# Patient Record
Sex: Male | Born: 1935 | Marital: Married | Smoking: Never smoker
Health system: Southern US, Community
[De-identification: ages and names within clinical notes are randomized; demographics above are authoritative.]

## PROBLEM LIST (undated history)

## (undated) DIAGNOSIS — C801 Malignant (primary) neoplasm, unspecified: Secondary | ICD-10-CM

## (undated) HISTORY — PX: HERNIA REPAIR: SHX51

---

## 2008-05-08 ENCOUNTER — Ambulatory Visit: Payer: Self-pay | Admitting: Surgery

## 2008-05-12 ENCOUNTER — Ambulatory Visit: Payer: Self-pay | Admitting: Surgery

## 2008-07-16 ENCOUNTER — Ambulatory Visit: Payer: Self-pay | Admitting: Internal Medicine

## 2008-08-06 ENCOUNTER — Ambulatory Visit: Payer: Self-pay | Admitting: Internal Medicine

## 2011-05-04 ENCOUNTER — Ambulatory Visit: Payer: Self-pay | Admitting: Pain Medicine

## 2011-05-09 ENCOUNTER — Ambulatory Visit: Payer: Self-pay | Admitting: Pain Medicine

## 2011-06-13 ENCOUNTER — Ambulatory Visit: Payer: Self-pay | Admitting: Pain Medicine

## 2011-07-12 ENCOUNTER — Ambulatory Visit: Payer: Self-pay | Admitting: Pain Medicine

## 2011-07-19 ENCOUNTER — Ambulatory Visit: Payer: Self-pay | Admitting: Pain Medicine

## 2011-07-27 ENCOUNTER — Ambulatory Visit: Payer: Self-pay | Admitting: Pain Medicine

## 2011-08-30 ENCOUNTER — Ambulatory Visit: Payer: Self-pay | Admitting: Pain Medicine

## 2013-02-14 ENCOUNTER — Ambulatory Visit: Payer: Self-pay | Admitting: Urology

## 2013-02-14 DIAGNOSIS — Z01810 Encounter for preprocedural cardiovascular examination: Secondary | ICD-10-CM

## 2013-02-26 ENCOUNTER — Ambulatory Visit: Payer: Self-pay | Admitting: Urology

## 2013-02-28 LAB — PATHOLOGY REPORT

## 2013-05-09 ENCOUNTER — Ambulatory Visit: Payer: Self-pay | Admitting: Pain Medicine

## 2013-05-22 ENCOUNTER — Ambulatory Visit: Payer: Self-pay | Admitting: Pain Medicine

## 2013-06-18 ENCOUNTER — Ambulatory Visit: Payer: Self-pay | Admitting: Pain Medicine

## 2013-07-01 ENCOUNTER — Ambulatory Visit: Payer: Self-pay | Admitting: Urology

## 2013-07-03 ENCOUNTER — Ambulatory Visit: Payer: Self-pay | Admitting: Pain Medicine

## 2013-07-09 ENCOUNTER — Ambulatory Visit: Payer: Self-pay | Admitting: Urology

## 2013-07-12 LAB — PATHOLOGY REPORT

## 2013-08-01 ENCOUNTER — Ambulatory Visit: Payer: Self-pay | Admitting: Pain Medicine

## 2014-08-15 NOTE — Op Note (Signed)
PATIENT NAME:  Troy Arroyo, Troy Arroyo MR#:  [id number] DATE OF BIRTH:  May 20, 1932  DATE OF PROCEDURE:  02/26/2013  PRINCIPAL DIAGNOSIS: Bladder tumor.   POSTOPERATIVE DIAGNOSIS: Bladder tumor.   PROCEDURE: Transurethral resection of bladder tumor, mitomycin bladder instillation.   SURGEON: Edrick Oh, M.D.   ANESTHESIA: General endotracheal anesthesia.   INDICATIONS: The patient is a 81 year old African American gentleman who was recently found to have hematuria. On evaluation he was noted to have a 1.5 to 2 cm papillary-appearing lesion that was somewhat irregular on the right posterolateral bladder wall. He presents for transurethral resection.   DESCRIPTION OF PROCEDURE: After informed consent was obtained, the patient was taken to the operating room and placed in the dorsal lithotomy position under general endotracheal anesthesia.  General endotracheal anesthesia was utilized due to the location of the tumor over concerns of an obturator reflex. The Saline resectoscope sheath was placed utilizing the visual obturator without difficulty. Upon entering the bladder, the mucosa was inspected in its entirety. There is an irregular 1.5 to 2 cm papillary-appearing lesion on the right lateral bladder base. Extensive hypervascularity was noted extending into the tumor region. No additional lesions were noted throughout the bladder. The resectoscope loop was placed through the sheath. Resection was begun at the posterior aspect of the tumor with a central section of the tumor removed in one segment. Additional passes of the resectoscope blade were utilized for complete removal. The resection was taken fairly deep into the bladder muscle. No bleeding was encountered. Extensive cauterization was undertaken of the tumor resection site and the surrounding edges of the hypervascular vessels were then also cauterized. The bladder was drained. The cystoscope was removed. The TUR chips were collected and will be sent for  pathology analysis. An 37 French red rubber catheter was then inserted without difficulty. 20 mg of mitomycin reconstituted in 50 mL of sterile saline was instilled into the urinary bladder through the Foley catheter. The catheter was then removed. Chemotherapy precautions were followed. The patient was returned to the supine position and awakened from general endotracheal anesthesia, was taken to the recovery room in stable condition. There were no problems or complications. The patient tolerated the procedure well.   ESTIMATED BLOOD LOSS: Minimal.    ____________________________ Denice Bors. Jacqlyn Larsen, MD bsc:dp D: 02/26/2013 11:50:26 ET T: 02/26/2013 12:01:14 ET JOB#: [id number]  cc: Denice Bors. Jacqlyn Larsen, MD, <Dictator> Denice Bors Shauna Bodkins MD ELECTRONICALLY SIGNED 02/26/2013 16:38

## 2014-08-16 NOTE — Op Note (Signed)
PATIENT NAME:  Troy Arroyo, Troy Arroyo MR#:  [id number] DATE OF BIRTH:  March 27, 1933  DATE OF PROCEDURE:  07/09/2013  PRINCIPAL DIAGNOSIS: Bladder tumor.   POSTOPERATIVE DIAGNOSIS: Bladder tumor.   PROCEDURE: Cystoscopy, bladder biopsy, mitomycin bladder instillation.   SURGEON: Denice Bors. Jacqlyn Larsen, MD   ANESTHESIA: Laryngeal mask airway anesthesia.   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 Recent cystoscopy demonstrated areas of papillary-appearing tumor on the anterior bladder wall approaching the bladder neck. There was a separate smaller area on the left posterior bladder also consistent with early recurrent papillary tumor. The site of the previous resection on the right demonstrates significant necrotic tissue with no evidence of active tumor. He presents for biopsy and treatment.   DESCRIPTION OF PROCEDURE: After informed consent was obtained, the patient was taken to the operating room and placed in the dorsal lithotomy position under laryngeal mask airway anesthesia. The patient was then prepped and draped in the usual standard fashion. The 22-French rigid cystoscope was introduced into the urethra under direct vision with no urethral abnormalities noted. Upon entering the bladder, the mucosa was inspected in its entirety. The previous area of resection just lateral to the right ureteral orifice demonstrates a large amount of necrotic tissue. Minimal erythema was noted surrounding the area of healing. There was no evidence of gross tumor. There was an approximately 1.5 cm area on the anterior bladder wall approaching the bladder neck with papillary-appearing tumor consistent with recurrent transitional cell carcinoma. There was a second 8 mm area on the left posterolateral bladder just behind the left ureteral orifice also consistent with early papillary-appearing tumor. A third smaller lesion was  noted just medial. Cold cup biopsy forceps were utilized to obtain multiple biopsies of the anterior tumor. This essentially resulted in resection of the tumor. Deeper bites were obtained for muscle. The area was then cauterized utilizing the Bugbee electrode. Pressure was applied to the abdomen for better visualization of the anterior tumor. The left posterolateral tumor was also removed utilizing cold cup biopsy forceps. This also included the second smaller lesion in close proximity. The area was extensively cauterized. The debris from the previous resection on the right was removed utilizing the biopsy forceps. The bladder was noted to be fairly thin at this area. The original decision was made to obtain biopsies from the base. This, however, was held due to the thinness of the bladder and the extent of the necrotic tumor. The bladder was irrigated of residual necrotic debris. The bladder was then drained. The cystoscope was removed. An 18-French red rubber catheter was inserted; 20 mg of mitomycin reconstituted in  50 mL of sterile water was instilled through the Foley catheter. The catheter was then removed. Proper chemotherapy protocols were followed. The patient tolerated the procedure well. There were no problems or complications.   ESTIMATED BLOOD LOSS: Minimal.   PATHOLOGY SPECIMENS: Included the anterior bladder wall and the left posterolateral bladder.    ____________________________ Denice Bors. Jacqlyn Larsen, MD bsc:jcm D: 07/09/2013 20:15:32 ET T: 07/09/2013 22:26:00 ET JOB#: [id number]  cc: Denice Bors. Jacqlyn Larsen, MD, <Dictator> Denice Bors Madgeline Rayo MD ELECTRONICALLY SIGNED 07/10/2013 17:42

## 2016-10-31 ENCOUNTER — Encounter: Payer: Self-pay | Admitting: Emergency Medicine

## 2016-10-31 ENCOUNTER — Emergency Department: Payer: Medicare HMO

## 2016-10-31 ENCOUNTER — Emergency Department
Admission: EM | Admit: 2016-10-31 | Discharge: 2016-10-31 | Disposition: A | Discharge status: Home / Self Care | Payer: Medicare HMO | Attending: Emergency Medicine | Admitting: Emergency Medicine

## 2016-10-31 DIAGNOSIS — M7918 Myalgia, other site: Secondary | ICD-10-CM

## 2016-10-31 DIAGNOSIS — Y998 Other external cause status: Secondary | ICD-10-CM | POA: Insufficient documentation

## 2016-10-31 DIAGNOSIS — M25551 Pain in right hip: Secondary | ICD-10-CM | POA: Diagnosis present

## 2016-10-31 DIAGNOSIS — M79604 Pain in right leg: Secondary | ICD-10-CM | POA: Diagnosis not present

## 2016-10-31 DIAGNOSIS — Y939 Activity, unspecified: Secondary | ICD-10-CM | POA: Diagnosis not present

## 2016-10-31 DIAGNOSIS — Y929 Unspecified place or not applicable: Secondary | ICD-10-CM | POA: Diagnosis not present

## 2016-10-31 DIAGNOSIS — M791 Myalgia: Secondary | ICD-10-CM | POA: Insufficient documentation

## 2016-10-31 HISTORY — DX: Malignant (primary) neoplasm, unspecified: C80.1

## 2016-10-31 MED ORDER — TRAMADOL HCL 50 MG PO TABS: 50.0000 mg | ORAL_TABLET | Freq: Two times a day (BID) | ORAL | 0 refills | Status: DC | PRN
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
## 2016-11-17 ENCOUNTER — Ambulatory Visit: Payer: Medicare HMO | Attending: Internal Medicine

## 2016-11-17 DIAGNOSIS — M545 Low back pain: Secondary | ICD-10-CM

## 2016-11-17 DIAGNOSIS — M25551 Pain in right hip: Secondary | ICD-10-CM | POA: Diagnosis present

## 2016-11-17 DIAGNOSIS — R262 Difficulty in walking, not elsewhere classified: Secondary | ICD-10-CM | POA: Diagnosis present

## 2016-11-17 DIAGNOSIS — M6281 Muscle weakness (generalized): Secondary | ICD-10-CM | POA: Insufficient documentation

## 2016-11-17 NOTE — Patient Instructions (Addendum)
  Seated hip extension isometrics.  Sitting on a chair (chair against the wall)   Squeeze your rear end muscles together and gently press your left foot onto the floor.    Count out loud for 5 seconds.    Repeat 5 times.    Perform 3 sets daily.        Pt was also recommended to use his adjustable SPC on L side and raise it as high as his greater trochanter. Pt demonstrated and verbalized understanding.
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
## 2016-11-21 ENCOUNTER — Ambulatory Visit: Payer: Medicare HMO

## 2016-11-21 DIAGNOSIS — M545 Low back pain: Secondary | ICD-10-CM

## 2016-11-21 DIAGNOSIS — R262 Difficulty in walking, not elsewhere classified: Secondary | ICD-10-CM

## 2016-11-21 DIAGNOSIS — M6281 Muscle weakness (generalized): Secondary | ICD-10-CM

## 2016-11-21 DIAGNOSIS — M25551 Pain in right hip: Secondary | ICD-10-CM

## 2016-11-21 NOTE — Patient Instructions (Signed)
   Do this exercise after you do the one when you are pressing your left foot onto the floor    Adduction: Hip - Knees Together (Sitting)   Sit with two folded pillows (not shown)  between knees. Squeeze your rear and muscles together and push knees together. Hold for _5__ seconds. Repeat _10__ times. Do __3_ times a day.  Copyright  VHI. All rights reserved.
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
## 2016-11-23 ENCOUNTER — Ambulatory Visit: Payer: Medicare HMO | Attending: Internal Medicine

## 2016-11-23 DIAGNOSIS — M25551 Pain in right hip: Secondary | ICD-10-CM | POA: Diagnosis not present

## 2016-11-23 DIAGNOSIS — R262 Difficulty in walking, not elsewhere classified: Secondary | ICD-10-CM | POA: Diagnosis present

## 2016-11-23 DIAGNOSIS — M545 Low back pain: Secondary | ICD-10-CM | POA: Diagnosis present

## 2016-11-23 DIAGNOSIS — M6281 Muscle weakness (generalized): Secondary | ICD-10-CM | POA: Diagnosis present
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
## 2016-11-29 ENCOUNTER — Ambulatory Visit: Payer: Medicare HMO

## 2016-11-29 DIAGNOSIS — M25551 Pain in right hip: Secondary | ICD-10-CM

## 2016-11-29 DIAGNOSIS — M6281 Muscle weakness (generalized): Secondary | ICD-10-CM

## 2016-11-29 DIAGNOSIS — M545 Low back pain: Secondary | ICD-10-CM

## 2016-11-29 DIAGNOSIS — R262 Difficulty in walking, not elsewhere classified: Secondary | ICD-10-CM
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
## 2016-12-01 ENCOUNTER — Ambulatory Visit: Payer: Medicare HMO

## 2016-12-01 DIAGNOSIS — M6281 Muscle weakness (generalized): Secondary | ICD-10-CM

## 2016-12-01 DIAGNOSIS — R262 Difficulty in walking, not elsewhere classified: Secondary | ICD-10-CM

## 2016-12-01 DIAGNOSIS — M25551 Pain in right hip: Secondary | ICD-10-CM | POA: Diagnosis not present

## 2016-12-01 DIAGNOSIS — M545 Low back pain: Secondary | ICD-10-CM
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
## 2016-12-05 ENCOUNTER — Ambulatory Visit: Payer: Medicare HMO

## 2016-12-05 DIAGNOSIS — M6281 Muscle weakness (generalized): Secondary | ICD-10-CM

## 2016-12-05 DIAGNOSIS — M25551 Pain in right hip: Secondary | ICD-10-CM

## 2016-12-05 DIAGNOSIS — M545 Low back pain: Secondary | ICD-10-CM

## 2016-12-05 DIAGNOSIS — R262 Difficulty in walking, not elsewhere classified: Secondary | ICD-10-CM
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
## 2016-12-07 ENCOUNTER — Ambulatory Visit: Payer: Medicare HMO

## 2016-12-08 ENCOUNTER — Ambulatory Visit: Payer: Medicare HMO

## 2016-12-08 DIAGNOSIS — M25551 Pain in right hip: Secondary | ICD-10-CM | POA: Diagnosis not present

## 2016-12-08 DIAGNOSIS — R262 Difficulty in walking, not elsewhere classified: Secondary | ICD-10-CM

## 2016-12-08 DIAGNOSIS — M545 Low back pain: Secondary | ICD-10-CM

## 2016-12-08 DIAGNOSIS — M6281 Muscle weakness (generalized): Secondary | ICD-10-CM
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
## 2016-12-12 ENCOUNTER — Ambulatory Visit: Payer: Medicare HMO

## 2016-12-12 DIAGNOSIS — M545 Low back pain: Secondary | ICD-10-CM

## 2016-12-12 DIAGNOSIS — M25551 Pain in right hip: Secondary | ICD-10-CM

## 2016-12-12 DIAGNOSIS — M6281 Muscle weakness (generalized): Secondary | ICD-10-CM

## 2016-12-12 DIAGNOSIS — R262 Difficulty in walking, not elsewhere classified: Secondary | ICD-10-CM
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
## 2016-12-14 ENCOUNTER — Ambulatory Visit: Payer: Medicare HMO

## 2016-12-14 DIAGNOSIS — R262 Difficulty in walking, not elsewhere classified: Secondary | ICD-10-CM

## 2016-12-14 DIAGNOSIS — M6281 Muscle weakness (generalized): Secondary | ICD-10-CM

## 2016-12-14 DIAGNOSIS — M25551 Pain in right hip: Secondary | ICD-10-CM | POA: Diagnosis not present

## 2016-12-14 DIAGNOSIS — M545 Low back pain: Secondary | ICD-10-CM
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
## 2016-12-19 ENCOUNTER — Ambulatory Visit: Payer: Medicare HMO

## 2016-12-19 DIAGNOSIS — M6281 Muscle weakness (generalized): Secondary | ICD-10-CM

## 2016-12-19 DIAGNOSIS — M25551 Pain in right hip: Secondary | ICD-10-CM

## 2016-12-19 DIAGNOSIS — R262 Difficulty in walking, not elsewhere classified: Secondary | ICD-10-CM

## 2016-12-19 DIAGNOSIS — M545 Low back pain: Secondary | ICD-10-CM
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
## 2016-12-21 ENCOUNTER — Ambulatory Visit: Payer: Medicare HMO

## 2016-12-21 DIAGNOSIS — R262 Difficulty in walking, not elsewhere classified: Secondary | ICD-10-CM

## 2016-12-21 DIAGNOSIS — M545 Low back pain: Secondary | ICD-10-CM

## 2016-12-21 DIAGNOSIS — M6281 Muscle weakness (generalized): Secondary | ICD-10-CM

## 2016-12-21 DIAGNOSIS — M25551 Pain in right hip: Secondary | ICD-10-CM | POA: Diagnosis not present
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
## 2016-12-27 ENCOUNTER — Ambulatory Visit: Payer: Medicare HMO | Attending: Internal Medicine

## 2016-12-27 DIAGNOSIS — M25551 Pain in right hip: Secondary | ICD-10-CM | POA: Diagnosis present

## 2016-12-27 DIAGNOSIS — M545 Low back pain: Secondary | ICD-10-CM | POA: Diagnosis present

## 2016-12-27 DIAGNOSIS — R262 Difficulty in walking, not elsewhere classified: Secondary | ICD-10-CM | POA: Diagnosis present

## 2016-12-27 DIAGNOSIS — M6281 Muscle weakness (generalized): Secondary | ICD-10-CM | POA: Insufficient documentation
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
## 2016-12-29 ENCOUNTER — Ambulatory Visit: Payer: Medicare HMO

## 2016-12-29 DIAGNOSIS — M25551 Pain in right hip: Secondary | ICD-10-CM

## 2016-12-29 DIAGNOSIS — R262 Difficulty in walking, not elsewhere classified: Secondary | ICD-10-CM

## 2016-12-29 DIAGNOSIS — M6281 Muscle weakness (generalized): Secondary | ICD-10-CM
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
## 2019-12-04 ENCOUNTER — Ambulatory Visit: Admit: 2019-12-04 | Payer: Medicare HMO | Admitting: Ophthalmology

## 2019-12-04 SURGERY — PHACOEMULSIFICATION, CATARACT, WITH IOL INSERTION
Anesthesia: Topical | Laterality: Left

## 2020-04-16 ENCOUNTER — Other Ambulatory Visit: Payer: Self-pay | Admitting: Internal Medicine

## 2020-04-16 DIAGNOSIS — R1310 Dysphagia, unspecified: Secondary | ICD-10-CM

## 2020-04-30 ENCOUNTER — Ambulatory Visit: Payer: Medicare HMO

## 2020-05-01 ENCOUNTER — Other Ambulatory Visit: Payer: Self-pay

## 2020-05-01 ENCOUNTER — Ambulatory Visit
Admission: RE | Admit: 2020-05-01 | Discharge: 2020-05-01 | Disposition: A | Discharge status: Home / Self Care | Payer: Medicare HMO | Source: Ambulatory Visit | Attending: Internal Medicine | Admitting: Internal Medicine

## 2020-05-01 DIAGNOSIS — R1310 Dysphagia, unspecified: Secondary | ICD-10-CM | POA: Diagnosis present

## 2021-01-25 ENCOUNTER — Ambulatory Visit: Admit: 2021-01-25 | Payer: Medicare HMO | Admitting: Internal Medicine

## 2021-01-25 SURGERY — ESOPHAGOGASTRODUODENOSCOPY (EGD) WITH PROPOFOL
Anesthesia: General

## 2022-05-26 ENCOUNTER — Other Ambulatory Visit: Payer: Self-pay | Admitting: Internal Medicine

## 2022-05-27 LAB — CBC WITH DIFFERENTIAL/PLATELET
Basophils Absolute: 0.1 10*3/uL (ref 0.0–0.2)
Basos: 1 %
EOS (ABSOLUTE): 0.1 10*3/uL (ref 0.0–0.4)
Eos: 2 %
Hematocrit: 38.6 % (ref 37.5–51.0)
Hemoglobin: 12.7 g/dL — ABNORMAL LOW (ref 13.0–17.7)
Immature Grans (Abs): 0.1 10*3/uL (ref 0.0–0.1)
Immature Granulocytes: 1 %
Lymphocytes Absolute: 2.1 10*3/uL (ref 0.7–3.1)
Lymphs: 31 %
MCH: 30 pg (ref 26.6–33.0)
MCHC: 32.9 g/dL (ref 31.5–35.7)
MCV: 91 fL (ref 79–97)
Monocytes Absolute: 0.7 10*3/uL (ref 0.1–0.9)
Monocytes: 11 %
Neutrophils Absolute: 3.8 10*3/uL (ref 1.4–7.0)
Neutrophils: 54 %
Platelets: 278 10*3/uL (ref 150–450)
RBC: 4.23 x10E6/uL (ref 4.14–5.80)
RDW: 12.3 % (ref 11.6–15.4)
WBC: 6.8 10*3/uL (ref 3.4–10.8)

## 2022-05-27 LAB — COMPREHENSIVE METABOLIC PANEL
ALT: 14 IU/L (ref 0–44)
AST: 21 IU/L (ref 0–40)
Albumin/Globulin Ratio: 1.6 (ref 1.2–2.2)
Albumin: 4.2 g/dL (ref 3.7–4.7)
Alkaline Phosphatase: 112 IU/L (ref 44–121)
BUN/Creatinine Ratio: 24 (ref 10–24)
BUN: 24 mg/dL (ref 8–27)
Bilirubin Total: 0.4 mg/dL (ref 0.0–1.2)
CO2: 23 mmol/L (ref 20–29)
Calcium: 9.3 mg/dL (ref 8.6–10.2)
Chloride: 100 mmol/L (ref 96–106)
Creatinine, Ser: 1.02 mg/dL (ref 0.76–1.27)
Globulin, Total: 2.7 g/dL (ref 1.5–4.5)
Glucose: 82 mg/dL (ref 70–99)
Potassium: 5.1 mmol/L (ref 3.5–5.2)
Sodium: 138 mmol/L (ref 134–144)
Total Protein: 6.9 g/dL (ref 6.0–8.5)
eGFR: 71 mL/min/{1.73_m2} (ref >59)

## 2022-05-27 LAB — LIPID PANEL W/O CHOL/HDL RATIO
Cholesterol, Total: 176 mg/dL (ref 100–199)
HDL: 88 mg/dL (ref >39)
LDL Chol Calc (NIH): 79 mg/dL (ref 0–99)
Triglycerides: 44 mg/dL (ref 0–149)
VLDL Cholesterol Cal: 9 mg/dL (ref 5–40)

## 2022-05-27 LAB — CK: Total CK: 366 U/L — ABNORMAL HIGH (ref 30–208)

## 2022-05-27 LAB — VITAMIN B12: Vitamin B-12: 252 pg/mL (ref 232–1245)

## 2022-05-30 ENCOUNTER — Encounter: Payer: Self-pay | Admitting: Internal Medicine

## 2022-05-30 ENCOUNTER — Ambulatory Visit (INDEPENDENT_AMBULATORY_CARE_PROVIDER_SITE_OTHER): Payer: Medicare HMO | Admitting: Internal Medicine

## 2022-05-30 VITALS — BP 140/60 | HR 66 | Ht 69.0 in | Wt 156.4 lb

## 2022-05-30 DIAGNOSIS — M5431 Sciatica, right side: Secondary | ICD-10-CM

## 2022-05-30 DIAGNOSIS — D538 Other specified nutritional anemias: Secondary | ICD-10-CM

## 2022-05-30 DIAGNOSIS — E7800 Pure hypercholesterolemia, unspecified: Secondary | ICD-10-CM | POA: Diagnosis not present

## 2022-05-30 DIAGNOSIS — Z0001 Encounter for general adult medical examination with abnormal findings: Secondary | ICD-10-CM

## 2022-05-30 MED ORDER — FUSION PLUS PO CAPS: 1.0000 | ORAL_CAPSULE | Freq: Every day | ORAL | 0 refills | Status: AC

## 2022-05-30 MED ORDER — GABAPENTIN 100 MG PO CAPS: 100.0000 mg | ORAL_CAPSULE | Freq: Every day | ORAL | 2 refills | Status: DC

## 2022-05-30 NOTE — Progress Notes (Signed)
   Subjective:    Patient ID: Troy Arroyo, male    DOB: 22-Sep-1932, 89 y.o.   MRN: [id number]  HPI No new complaints, here for AWV and refer to scanned documents.  Also here for lab review and medication refills. Labs reviewed and notable for well controlled lipids, unremarkable cmp but cbc notable for slight anemia. C/o intermittent blanching of the fingers of his right hand in cold weather.   Review of Systems  Negative except hpi      Objective:   Physical Exam Vitals and nursing note reviewed.  Constitutional:      Appearance: Normal appearance.  HENT:     Head: Normocephalic.     Left Ear: There is no impacted cerumen.     Nose: Nose normal.     Mouth/Throat:     Mouth: Mucous membranes are moist.  Eyes:     Extraocular Movements: Extraocular movements intact.     Pupils: Pupils are equal, round, and reactive to light.  Cardiovascular:     Rate and Rhythm: Normal rate and regular rhythm.     Heart sounds: Normal heart sounds. No murmur heard. Pulmonary:     Effort: Pulmonary effort is normal.     Breath sounds: Normal air entry. No rhonchi or rales.  Abdominal:     General: Bowel sounds are normal.  Musculoskeletal:     Cervical back: Normal range of motion and neck supple.  Neurological:     General: No focal deficit present.     Mental Status: He is alert.  Psychiatric:        Mood and Affect: Mood normal.        Behavior: Behavior normal.           Assessment & Plan:  Anemia-start otc iron and folate Refill meds

## 2022-06-11 LAB — AMB RESULTS CONSOLE CBG: Glucose: 102

## 2022-06-20 ENCOUNTER — Encounter: Payer: Self-pay | Admitting: *Deleted

## 2022-06-20 NOTE — Progress Notes (Signed)
Pt has established PCP, Dr. Elijio Miles, with whom he has had ongoing care over the past rolling 12 months, most recently on 05/30/22 and w/ whom he has a future appt on 08/30/22. 06/11/22 screening event results wnl and no SDOH barriers noted as of this event. Pt also has ongoing urology specialty care. No additional health equity team support indicated at this time.

## 2022-08-29 ENCOUNTER — Other Ambulatory Visit: Payer: Medicare HMO

## 2022-08-30 ENCOUNTER — Ambulatory Visit (INDEPENDENT_AMBULATORY_CARE_PROVIDER_SITE_OTHER): Payer: Medicare HMO | Admitting: Internal Medicine

## 2022-08-30 ENCOUNTER — Encounter: Payer: Self-pay | Admitting: Internal Medicine

## 2022-08-30 VITALS — BP 130/70 | HR 61 | Ht 69.0 in | Wt 157.8 lb

## 2022-08-30 DIAGNOSIS — E7800 Pure hypercholesterolemia, unspecified: Secondary | ICD-10-CM

## 2022-08-30 DIAGNOSIS — D538 Other specified nutritional anemias: Secondary | ICD-10-CM | POA: Diagnosis not present

## 2022-08-30 DIAGNOSIS — J209 Acute bronchitis, unspecified: Secondary | ICD-10-CM | POA: Diagnosis not present

## 2022-08-30 LAB — CBC WITH DIFFERENTIAL/PLATELET
Basophils Absolute: 0 10*3/uL (ref 0.0–0.2)
Basos: 1 %
EOS (ABSOLUTE): 0.2 10*3/uL (ref 0.0–0.4)
Eos: 2 %
Hematocrit: 37.9 % (ref 37.5–51.0)
Hemoglobin: 12.3 g/dL — ABNORMAL LOW (ref 13.0–17.7)
Immature Grans (Abs): 0 10*3/uL (ref 0.0–0.1)
Immature Granulocytes: 0 %
Lymphocytes Absolute: 1.8 10*3/uL (ref 0.7–3.1)
Lymphs: 27 %
MCH: 29.2 pg (ref 26.6–33.0)
MCHC: 32.5 g/dL (ref 31.5–35.7)
MCV: 90 fL (ref 79–97)
Monocytes Absolute: 0.7 10*3/uL (ref 0.1–0.9)
Monocytes: 10 %
Neutrophils Absolute: 4 10*3/uL (ref 1.4–7.0)
Neutrophils: 60 %
Platelets: 257 10*3/uL (ref 150–450)
RBC: 4.21 x10E6/uL (ref 4.14–5.80)
RDW: 12.6 % (ref 11.6–15.4)
WBC: 6.7 10*3/uL (ref 3.4–10.8)

## 2022-08-30 LAB — LIPID PANEL
Chol/HDL Ratio: 2.2 ratio (ref 0.0–5.0)
Cholesterol, Total: 176 mg/dL (ref 100–199)
HDL: 80 mg/dL (ref >39)
LDL Chol Calc (NIH): 82 mg/dL (ref 0–99)
Triglycerides: 74 mg/dL (ref 0–149)
VLDL Cholesterol Cal: 14 mg/dL (ref 5–40)

## 2022-08-30 LAB — HEPATIC FUNCTION PANEL
ALT: 18 IU/L (ref 0–44)
AST: 34 IU/L (ref 0–40)
Albumin: 4.3 g/dL (ref 3.7–4.7)
Alkaline Phosphatase: 119 IU/L (ref 44–121)
Bilirubin Total: 0.5 mg/dL (ref 0.0–1.2)
Bilirubin, Direct: 0.16 mg/dL (ref 0.00–0.40)
Total Protein: 6.7 g/dL (ref 6.0–8.5)

## 2022-08-30 MED ORDER — METHYLPREDNISOLONE 4 MG PO TBPK
ORAL_TABLET | ORAL | 0 refills | Status: DC
Start: 2022-08-30 — End: 2022-12-27

## 2022-08-30 MED ORDER — AZITHROMYCIN 250 MG PO TABS
ORAL_TABLET | ORAL | 0 refills | Status: DC
Start: 2022-08-30 — End: 2023-09-06

## 2022-08-30 MED ORDER — BENZONATATE 100 MG PO CAPS
100.0000 mg | ORAL_CAPSULE | Freq: Three times a day (TID) | ORAL | 0 refills | Status: DC | PRN
Start: 2022-08-30 — End: 2023-09-06

## 2022-08-30 NOTE — Progress Notes (Signed)
Established Patient Office Visit  Subjective:  Patient ID: Troy Arroyo, male    DOB: December 07, 1932  Age: 89 y.o. MRN: [id number]  Chief Complaint  Patient presents with   Follow-up    3 month follow up    C/o chest congestion and cough x 1-2 wks. Cough productive of whitish sputum, denies wheezing and SOB. Denies fever, chills or malaise.    No other concerns at this time.   Past Medical History:  Diagnosis Date   Cancer [facility])     Past Surgical History:  Procedure Laterality Date   HERNIA REPAIR      Social History   Socioeconomic History   Marital status: Married    Spouse name: Not on file   Number of children: Not on file   Years of education: Not on file   Highest education level: Not on file  Occupational History   Not on file  Tobacco Use   Smoking status: Never   Smokeless tobacco: Never  Substance and Sexual Activity   Alcohol use: Yes    Comment: occasionally   Drug use: Not on file   Sexual activity: Not on file  Other Topics Concern   Not on file  Social History Narrative   Not on file   Social Determinants of Health   Financial Resource Strain: Not on file  Food Insecurity: Not on file  Transportation Needs: Not on file  Physical Activity: Not on file  Stress: Not on file  Social Connections: Not on file  Intimate Partner Violence: Not on file    No family history on file.  No Known Allergies  Review of Systems  All other systems reviewed and are negative.      Objective:   BP 130/70   Pulse 61   Ht 5\' 9"  (1.753 m)   Wt 157 lb 12.8 oz (71.6 kg)   SpO2 96%   BMI 23.30 kg/m   Vitals:   08/30/22 1017  BP: 130/70  Pulse: 61  Height: 5\' 9"  (1.753 m)  Weight: 157 lb 12.8 oz (71.6 kg)  SpO2: 96%  BMI (Calculated): 23.29    Physical Exam Vitals reviewed.  Constitutional:      Appearance: Normal appearance.  HENT:     Head: Normocephalic.     Left Ear: There is no impacted cerumen.     Nose: Nose normal.      Mouth/Throat:     Mouth: Mucous membranes are moist.     Pharynx: No posterior oropharyngeal erythema.  Eyes:     Extraocular Movements: Extraocular movements intact.     Pupils: Pupils are equal, round, and reactive to light.  Cardiovascular:     Rate and Rhythm: Regular rhythm.     Chest Wall: PMI is not displaced.     Pulses: Normal pulses.     Heart sounds: Normal heart sounds. No murmur heard. Pulmonary:     Effort: Pulmonary effort is normal.     Breath sounds: Normal air entry. Wheezing present. No rhonchi or rales.  Abdominal:     General: Abdomen is flat. Bowel sounds are normal. There is no distension.     Palpations: Abdomen is soft. There is no hepatomegaly, splenomegaly or mass.     Tenderness: There is no abdominal tenderness.  Musculoskeletal:        General: Normal range of motion.     Cervical back: Normal range of motion and neck supple.     Right lower leg: No  edema.     Left lower leg: No edema.  Skin:    General: Skin is warm and dry.  Neurological:     General: No focal deficit present.     Mental Status: He is alert and oriented to person, place, and time.     Cranial Nerves: No cranial nerve deficit.     Motor: No weakness.  Psychiatric:        Mood and Affect: Mood normal.        Behavior: Behavior normal.      No results found for any visits on 08/30/22.  Recent Results (from the past 2160 hour(Lyndal Reggio))  CBG     Status: None   Collection Time: 06/11/22 12:00 AM  Result Value Ref Range   Glucose 102   CBC with Differential/Platelet     Status: Abnormal   Collection Time: 08/29/22  8:33 AM  Result Value Ref Range   WBC 6.7 3.4 - 10.8 x10E3/uL   RBC 4.21 4.14 - 5.80 x10E6/uL   Hemoglobin 12.3 (L) 13.0 - 17.7 g/dL   Hematocrit 65.7 84.6 - 51.0 %   MCV 90 79 - 97 fL   MCH 29.2 26.6 - 33.0 pg   MCHC 32.5 31.5 - 35.7 g/dL   RDW 96.2 95.2 - 84.1 %   Platelets 257 150 - 450 x10E3/uL   Neutrophils 60 Not Estab. %   Lymphs 27 Not Estab. %    Monocytes 10 Not Estab. %   Eos 2 Not Estab. %   Basos 1 Not Estab. %   Neutrophils Absolute 4.0 1.4 - 7.0 x10E3/uL   Lymphocytes Absolute 1.8 0.7 - 3.1 x10E3/uL   Monocytes Absolute 0.7 0.1 - 0.9 x10E3/uL   EOS (ABSOLUTE) 0.2 0.0 - 0.4 x10E3/uL   Basophils Absolute 0.0 0.0 - 0.2 x10E3/uL   Immature Granulocytes 0 Not Estab. %   Immature Grans (Abs) 0.0 0.0 - 0.1 x10E3/uL  Lipid Profile     Status: None   Collection Time: 08/29/22  8:33 AM  Result Value Ref Range   Cholesterol, Total 176 100 - 199 mg/dL   Triglycerides 74 0 - 149 mg/dL   HDL 80 >32 mg/dL   VLDL Cholesterol Cal 14 5 - 40 mg/dL   LDL Chol Calc (NIH) 82 0 - 99 mg/dL   Chol/HDL Ratio 2.2 0.0 - 5.0 ratio    Comment:                                   T. Chol/HDL Ratio                                             Men  Women                               1/2 Avg.Risk  3.4    3.3                                   Avg.Risk  5.0    4.4  2X Avg.Risk  9.6    7.1                                3X Avg.Risk 23.4   11.0   Hepatic function panel     Status: None   Collection Time: 08/29/22  8:33 AM  Result Value Ref Range   Total Protein 6.7 6.0 - 8.5 g/dL   Albumin 4.3 3.7 - 4.7 g/dL   Bilirubin Total 0.5 0.0 - 1.2 mg/dL   Bilirubin, Direct 1.61 0.00 - 0.40 mg/dL   Alkaline Phosphatase 119 44 - 121 IU/L   AST 34 0 - 40 IU/L   ALT 18 0 - 44 IU/L      Assessment & Plan:   Problem List Items Addressed This Visit       Other   Pure hypercholesterolemia - Primary   Relevant Medications   simvastatin (ZOCOR) 10 MG tablet   Other Relevant Orders   Comprehensive metabolic panel   Lipid panel   Other Visit Diagnoses     Acute bronchitis, unspecified organism       Relevant Medications   azithromycin (ZITHROMAX Z-PAK) 250 MG tablet   methylPREDNISolone (MEDROL DOSEPAK) 4 MG TBPK tablet   Other specified nutritional anemias       Relevant Medications   Iron Polysacch Cmplx-B12-FA  (POLY-IRON 150 FORTE) 150-0.025-1 MG CAPS   Other Relevant Orders   CBC With Diff/Platelet       Return in about 3 months (around 11/30/2022) for awv with labs prior.   Total time spent: 30 minutes  Luna Fuse, MD  08/30/2022

## 2022-12-23 ENCOUNTER — Other Ambulatory Visit: Payer: Medicare HMO

## 2022-12-23 DIAGNOSIS — D538 Other specified nutritional anemias: Secondary | ICD-10-CM

## 2022-12-23 DIAGNOSIS — E7800 Pure hypercholesterolemia, unspecified: Secondary | ICD-10-CM

## 2022-12-24 LAB — COMPREHENSIVE METABOLIC PANEL
ALT: 16 IU/L (ref 0–44)
AST: 26 IU/L (ref 0–40)
Albumin: 4.2 g/dL (ref 3.7–4.7)
Alkaline Phosphatase: 117 IU/L (ref 44–121)
BUN/Creatinine Ratio: 19 (ref 10–24)
BUN: 18 mg/dL (ref 8–27)
Bilirubin Total: 0.5 mg/dL (ref 0.0–1.2)
CO2: 24 mmol/L (ref 20–29)
Calcium: 9.2 mg/dL (ref 8.6–10.2)
Chloride: 103 mmol/L (ref 96–106)
Creatinine, Ser: 0.96 mg/dL (ref 0.76–1.27)
Globulin, Total: 2.4 g/dL (ref 1.5–4.5)
Glucose: 84 mg/dL (ref 70–99)
Potassium: 5.2 mmol/L (ref 3.5–5.2)
Sodium: 140 mmol/L (ref 134–144)
Total Protein: 6.6 g/dL (ref 6.0–8.5)
eGFR: 76 mL/min/{1.73_m2} (ref >59)

## 2022-12-24 LAB — CBC WITH DIFF/PLATELET
Basophils Absolute: 0.1 10*3/uL (ref 0.0–0.2)
Basos: 1 %
EOS (ABSOLUTE): 0.2 10*3/uL (ref 0.0–0.4)
Eos: 2 %
Hematocrit: 37.6 % (ref 37.5–51.0)
Hemoglobin: 12.4 g/dL — ABNORMAL LOW (ref 13.0–17.7)
Immature Grans (Abs): 0.1 10*3/uL (ref 0.0–0.1)
Immature Granulocytes: 1 %
Lymphocytes Absolute: 1.6 10*3/uL (ref 0.7–3.1)
Lymphs: 25 %
MCH: 30.3 pg (ref 26.6–33.0)
MCHC: 33 g/dL (ref 31.5–35.7)
MCV: 92 fL (ref 79–97)
Monocytes Absolute: 0.7 10*3/uL (ref 0.1–0.9)
Monocytes: 11 %
Neutrophils Absolute: 3.7 10*3/uL (ref 1.4–7.0)
Neutrophils: 60 %
Platelets: 247 10*3/uL (ref 150–450)
RBC: 4.09 x10E6/uL — ABNORMAL LOW (ref 4.14–5.80)
RDW: 11.9 % (ref 11.6–15.4)
WBC: 6.2 10*3/uL (ref 3.4–10.8)

## 2022-12-24 LAB — LIPID PANEL
Chol/HDL Ratio: 2.2 ratio (ref 0.0–5.0)
Cholesterol, Total: 182 mg/dL (ref 100–199)
HDL: 84 mg/dL (ref >39)
LDL Chol Calc (NIH): 84 mg/dL (ref 0–99)
Triglycerides: 75 mg/dL (ref 0–149)
VLDL Cholesterol Cal: 14 mg/dL (ref 5–40)

## 2022-12-27 ENCOUNTER — Ambulatory Visit (INDEPENDENT_AMBULATORY_CARE_PROVIDER_SITE_OTHER): Payer: Medicare HMO | Admitting: Internal Medicine

## 2022-12-27 VITALS — BP 130/79 | Ht 69.0 in | Wt 156.6 lb

## 2022-12-27 DIAGNOSIS — M5431 Sciatica, right side: Secondary | ICD-10-CM

## 2022-12-27 DIAGNOSIS — E7800 Pure hypercholesterolemia, unspecified: Secondary | ICD-10-CM

## 2022-12-27 MED ORDER — SIMVASTATIN 10 MG PO TABS
10.0000 mg | ORAL_TABLET | Freq: Every day | ORAL | 1 refills | Status: DC
Start: 2022-12-27 — End: 2023-09-06

## 2022-12-27 MED ORDER — GABAPENTIN 100 MG PO CAPS
100.0000 mg | ORAL_CAPSULE | Freq: Every day | ORAL | 2 refills | Status: DC
Start: 2022-12-27 — End: 2023-09-06

## 2022-12-27 NOTE — Progress Notes (Signed)
Established Patient Office Visit  Subjective:  Patient ID: Troy Arroyo, male    DOB: 02-20-1933  Age: 89 y.o. MRN: [id number]  Chief Complaint  Patient presents with   Follow-up    3 mo f/u with labs    No new complaints, here for lab review and medication refills.LDL and TC well controlled on lab review. Triglycerides also satisfactory with unremarkable cmp. CBC notable for stable anemia.      No other concerns at this time.   Past Medical History:  Diagnosis Date   Cancer [facility])     Past Surgical History:  Procedure Laterality Date   HERNIA REPAIR      Social History   Socioeconomic History   Marital status: Married    Spouse name: Not on file   Number of children: Not on file   Years of education: Not on file   Highest education level: Not on file  Occupational History   Not on file  Tobacco Use   Smoking status: Never   Smokeless tobacco: Never  Substance and Sexual Activity   Alcohol use: Yes    Comment: occasionally   Drug use: Not on file   Sexual activity: Not on file  Other Topics Concern   Not on file  Social History Narrative   Not on file   Social Determinants of Health   Financial Resource Strain: Not on file  Food Insecurity: Not on file  Transportation Needs: Not on file  Physical Activity: Not on file  Stress: Not on file  Social Connections: Not on file  Intimate Partner Violence: Not on file    No family history on file.  No Known Allergies  Review of Systems  All other systems reviewed and are negative.      Objective:   BP 130/79   Ht 5\' 9"  (1.753 m)   Wt 156 lb 9.6 oz (71 kg)   BMI 23.13 kg/m   Vitals:   12/27/22 0946  BP: 130/79  Height: 5\' 9"  (1.753 m)  Weight: 156 lb 9.6 oz (71 kg)  BMI (Calculated): 23.12    Physical Exam Vitals reviewed.  Constitutional:      Appearance: Normal appearance.  HENT:     Head: Normocephalic.     Left Ear: There is no impacted cerumen.     Nose: Nose normal.      Mouth/Throat:     Mouth: Mucous membranes are moist.     Pharynx: No posterior oropharyngeal erythema.  Eyes:     Extraocular Movements: Extraocular movements intact.     Pupils: Pupils are equal, round, and reactive to light.  Cardiovascular:     Rate and Rhythm: Regular rhythm.     Chest Wall: PMI is not displaced.     Pulses: Normal pulses.     Heart sounds: Normal heart sounds. No murmur heard. Pulmonary:     Effort: Pulmonary effort is normal.     Breath sounds: Normal air entry. Wheezing present. No rhonchi or rales.  Abdominal:     General: Abdomen is flat. Bowel sounds are normal. There is no distension.     Palpations: Abdomen is soft. There is no hepatomegaly, splenomegaly or mass.     Tenderness: There is no abdominal tenderness.  Musculoskeletal:        General: Normal range of motion.     Cervical back: Normal range of motion and neck supple.     Right lower leg: No edema.  Left lower leg: No edema.  Skin:    General: Skin is warm and dry.  Neurological:     General: No focal deficit present.     Mental Status: He is alert and oriented to person, place, and time.     Cranial Nerves: No cranial nerve deficit.     Motor: No weakness.  Psychiatric:        Mood and Affect: Mood normal.        Behavior: Behavior normal.      No results found for any visits on 12/27/22.  Recent Results (from the past 2160 hour(Deion Swift))  Lipid panel     Status: None   Collection Time: 12/23/22  8:33 AM  Result Value Ref Range   Cholesterol, Total 182 100 - 199 mg/dL   Triglycerides 75 0 - 149 mg/dL   HDL 84 >29 mg/dL   VLDL Cholesterol Cal 14 5 - 40 mg/dL   LDL Chol Calc (NIH) 84 0 - 99 mg/dL   Chol/HDL Ratio 2.2 0.0 - 5.0 ratio    Comment:                                   T. Chol/HDL Ratio                                             Men  Women                               1/2 Avg.Risk  3.4    3.3                                   Avg.Risk  5.0    4.4                                 2X Avg.Risk  9.6    7.1                                3X Avg.Risk 23.4   11.0   Comprehensive metabolic panel     Status: None   Collection Time: 12/23/22  8:33 AM  Result Value Ref Range   Glucose 84 70 - 99 mg/dL   BUN 18 8 - 27 mg/dL   Creatinine, Ser 5.62 0.76 - 1.27 mg/dL   eGFR 76 >13 YQ/MVH/8.46   BUN/Creatinine Ratio 19 10 - 24   Sodium 140 134 - 144 mmol/L   Potassium 5.2 3.5 - 5.2 mmol/L   Chloride 103 96 - 106 mmol/L   CO2 24 20 - 29 mmol/L   Calcium 9.2 8.6 - 10.2 mg/dL   Total Protein 6.6 6.0 - 8.5 g/dL   Albumin 4.2 3.7 - 4.7 g/dL   Globulin, Total 2.4 1.5 - 4.5 g/dL   Bilirubin Total 0.5 0.0 - 1.2 mg/dL   Alkaline Phosphatase 117 44 - 121 IU/L   AST 26 0 - 40 IU/L   ALT 16 0 - 44 IU/L  CBC With Diff/Platelet     Status: Abnormal   Collection  Time: 12/23/22  8:33 AM  Result Value Ref Range   WBC 6.2 3.4 - 10.8 x10E3/uL   RBC 4.09 (L) 4.14 - 5.80 x10E6/uL   Hemoglobin 12.4 (L) 13.0 - 17.7 g/dL   Hematocrit 91.4 78.2 - 51.0 %   MCV 92 79 - 97 fL   MCH 30.3 26.6 - 33.0 pg   MCHC 33.0 31.5 - 35.7 g/dL   RDW 95.6 21.3 - 08.6 %   Platelets 247 150 - 450 x10E3/uL   Neutrophils 60 Not Estab. %   Lymphs 25 Not Estab. %   Monocytes 11 Not Estab. %   Eos 2 Not Estab. %   Basos 1 Not Estab. %   Neutrophils Absolute 3.7 1.4 - 7.0 x10E3/uL   Lymphocytes Absolute 1.6 0.7 - 3.1 x10E3/uL   Monocytes Absolute 0.7 0.1 - 0.9 x10E3/uL   EOS (ABSOLUTE) 0.2 0.0 - 0.4 x10E3/uL   Basophils Absolute 0.1 0.0 - 0.2 x10E3/uL   Immature Granulocytes 1 Not Estab. %   Immature Grans (Abs) 0.1 0.0 - 0.1 x10E3/uL      Assessment & Plan:  As per problem list  Problem List Items Addressed This Visit       Nervous and Auditory   Sciatica of right side - Primary   Relevant Medications   gabapentin (NEURONTIN) 100 MG capsule   Other Relevant Orders   CBC With Diff/Platelet     Other   Pure hypercholesterolemia   Relevant Medications   simvastatin (ZOCOR) 10 MG  tablet   Other Relevant Orders   Lipid panel   Hepatic function panel   CK    Return in about 4 months (around 04/28/2023) for fu with labs prior.   Total time spent: 20 minutes  Luna Fuse, MD  12/27/2022   This document may have been prepared by [facility] Voice Recognition software and as such may include unintentional dictation errors.

## 2023-04-24 ENCOUNTER — Other Ambulatory Visit: Payer: Medicare HMO

## 2023-04-24 DIAGNOSIS — E7800 Pure hypercholesterolemia, unspecified: Secondary | ICD-10-CM

## 2023-04-24 DIAGNOSIS — M5431 Sciatica, right side: Secondary | ICD-10-CM

## 2023-04-25 LAB — CBC WITH DIFF/PLATELET
Basophils Absolute: 0 10*3/uL (ref 0.0–0.2)
Basos: 1 %
EOS (ABSOLUTE): 0.1 10*3/uL (ref 0.0–0.4)
Eos: 2 %
Hematocrit: 39.3 % (ref 37.5–51.0)
Hemoglobin: 12.4 g/dL — ABNORMAL LOW (ref 13.0–17.7)
Immature Grans (Abs): 0.1 10*3/uL (ref 0.0–0.1)
Immature Granulocytes: 1 %
Lymphocytes Absolute: 1.8 10*3/uL (ref 0.7–3.1)
Lymphs: 26 %
MCH: 29.5 pg (ref 26.6–33.0)
MCHC: 31.6 g/dL (ref 31.5–35.7)
MCV: 93 fL (ref 79–97)
Monocytes Absolute: 0.6 10*3/uL (ref 0.1–0.9)
Monocytes: 8 %
Neutrophils Absolute: 4.5 10*3/uL (ref 1.4–7.0)
Neutrophils: 62 %
Platelets: 279 10*3/uL (ref 150–450)
RBC: 4.21 x10E6/uL (ref 4.14–5.80)
RDW: 12.7 % (ref 11.6–15.4)
WBC: 7.2 10*3/uL (ref 3.4–10.8)

## 2023-04-25 LAB — HEPATIC FUNCTION PANEL
ALT: 13 [IU]/L (ref 0–44)
AST: 24 [IU]/L (ref 0–40)
Albumin: 4.2 g/dL (ref 3.7–4.7)
Alkaline Phosphatase: 124 [IU]/L — ABNORMAL HIGH (ref 44–121)
Bilirubin Total: 0.5 mg/dL (ref 0.0–1.2)
Bilirubin, Direct: 0.16 mg/dL (ref 0.00–0.40)
Total Protein: 7.1 g/dL (ref 6.0–8.5)

## 2023-04-25 LAB — CK: Total CK: 416 U/L — ABNORMAL HIGH (ref 30–208)

## 2023-04-25 LAB — LIPID PANEL
Chol/HDL Ratio: 2.2 {ratio} (ref 0.0–5.0)
Cholesterol, Total: 200 mg/dL — ABNORMAL HIGH (ref 100–199)
HDL: 89 mg/dL (ref >39)
LDL Chol Calc (NIH): 101 mg/dL — ABNORMAL HIGH (ref 0–99)
Triglycerides: 52 mg/dL (ref 0–149)
VLDL Cholesterol Cal: 10 mg/dL (ref 5–40)

## 2023-04-28 ENCOUNTER — Ambulatory Visit (INDEPENDENT_AMBULATORY_CARE_PROVIDER_SITE_OTHER): Payer: Medicare HMO | Admitting: Internal Medicine

## 2023-04-28 VITALS — BP 110/72 | HR 53 | Resp 93 | Ht 69.0 in | Wt 162.4 lb

## 2023-04-28 DIAGNOSIS — M5431 Sciatica, right side: Secondary | ICD-10-CM | POA: Diagnosis not present

## 2023-04-28 DIAGNOSIS — E7800 Pure hypercholesterolemia, unspecified: Secondary | ICD-10-CM

## 2023-04-28 DIAGNOSIS — Z0130 Encounter for examination of blood pressure without abnormal findings: Secondary | ICD-10-CM

## 2023-04-28 NOTE — Progress Notes (Signed)
 Established Patient Office Visit  Subjective:  Patient ID: Troy Arroyo, male    DOB: 09/29/1932  Age: 89 y.o. MRN: [id number]  Chief Complaint  Patient presents with   Follow-up    No new complaints, here for lab review and medication refills. LDL and TC elevated on lab review. Triglycerides satisfactory.    No other concerns at this time.   Past Medical History:  Diagnosis Date   Cancer [facility])     Past Surgical History:  Procedure Laterality Date   HERNIA REPAIR      Social History   Socioeconomic History   Marital status: Married    Spouse name: Not on file   Number of children: Not on file   Years of education: Not on file   Highest education level: Not on file  Occupational History   Not on file  Tobacco Use   Smoking status: Never   Smokeless tobacco: Never  Substance and Sexual Activity   Alcohol use: Yes    Comment: occasionally   Drug use: Not on file   Sexual activity: Not on file  Other Topics Concern   Not on file  Social History Narrative   Not on file   Social Drivers of Health   Financial Resource Strain: Not on file  Food Insecurity: Not on file  Transportation Needs: Not on file  Physical Activity: Not on file  Stress: Not on file  Social Connections: Not on file  Intimate Partner Violence: Not on file    No family history on file.  No Known Allergies  Outpatient Medications Prior to Visit  Medication Sig   Iron Polysacch Cmplx-B12-FA (POLY-IRON 150 FORTE) 150-0.025-1 MG CAPS Take 1 capsule by mouth daily.   simvastatin  (ZOCOR ) 10 MG tablet Take 1 tablet (10 mg total) by mouth daily at 6 PM.   traMADol  (ULTRAM ) 50 MG tablet Take 1 tablet (50 mg total) by mouth every 12 (twelve) hours as needed.   No facility-administered medications prior to visit.    Review of Systems  Constitutional:  Negative for weight loss (gained 6 lbs).  All other systems reviewed and are negative.      Objective:   BP 110/72   Pulse (!)  53   Resp (!) 93   Ht 5' 9 (1.753 m)   Wt 162 lb 6.4 oz (73.7 kg)   BMI 23.98 kg/m   Vitals:   04/28/23 1006  BP: 110/72  Pulse: (!) 53  Resp: (!) 93  Height: 5' 9 (1.753 m)  Weight: 162 lb 6.4 oz (73.7 kg)  BMI (Calculated): 23.97    Physical Exam Vitals reviewed.  Constitutional:      Appearance: Normal appearance.  HENT:     Head: Normocephalic.     Left Ear: There is no impacted cerumen.     Nose: Nose normal.     Mouth/Throat:     Mouth: Mucous membranes are moist.     Pharynx: No posterior oropharyngeal erythema.  Eyes:     Extraocular Movements: Extraocular movements intact.     Pupils: Pupils are equal, round, and reactive to light.  Cardiovascular:     Rate and Rhythm: Regular rhythm.     Chest Wall: PMI is not displaced.     Pulses: Normal pulses.     Heart sounds: Normal heart sounds. No murmur heard. Pulmonary:     Effort: Pulmonary effort is normal.     Breath sounds: Normal air entry. Wheezing present. No rhonchi  or rales.  Abdominal:     General: Abdomen is flat. Bowel sounds are normal. There is no distension.     Palpations: Abdomen is soft. There is no hepatomegaly, splenomegaly or mass.     Tenderness: There is no abdominal tenderness.  Musculoskeletal:        General: Normal range of motion.     Cervical back: Normal range of motion and neck supple.     Right lower leg: No edema.     Left lower leg: No edema.  Skin:    General: Skin is warm and dry.  Neurological:     General: No focal deficit present.     Mental Status: He is alert and oriented to person, place, and time.     Cranial Nerves: No cranial nerve deficit.     Motor: No weakness.  Psychiatric:        Mood and Affect: Mood normal.        Behavior: Behavior normal.      No results found for any visits on 04/28/23.  Recent Results (from the past 2160 hours)  Hepatic function panel     Status: Abnormal   Collection Time: 04/24/23  9:39 AM  Result Value Ref Range   Total  Protein 7.1 6.0 - 8.5 g/dL   Albumin 4.2 3.7 - 4.7 g/dL   Bilirubin Total 0.5 0.0 - 1.2 mg/dL   Bilirubin, Direct 9.83 0.00 - 0.40 mg/dL   Alkaline Phosphatase 124 (H) 44 - 121 IU/L   AST 24 0 - 40 IU/L   ALT 13 0 - 44 IU/L  CBC With Diff/Platelet     Status: Abnormal   Collection Time: 04/24/23  9:39 AM  Result Value Ref Range   WBC 7.2 3.4 - 10.8 x10E3/uL   RBC 4.21 4.14 - 5.80 x10E6/uL   Hemoglobin 12.4 (L) 13.0 - 17.7 g/dL   Hematocrit 60.6 62.4 - 51.0 %   MCV 93 79 - 97 fL   MCH 29.5 26.6 - 33.0 pg   MCHC 31.6 31.5 - 35.7 g/dL   RDW 87.2 88.3 - 84.5 %   Platelets 279 150 - 450 x10E3/uL   Neutrophils 62 Not Estab. %   Lymphs 26 Not Estab. %   Monocytes 8 Not Estab. %   Eos 2 Not Estab. %   Basos 1 Not Estab. %   Neutrophils Absolute 4.5 1.4 - 7.0 x10E3/uL   Lymphocytes Absolute 1.8 0.7 - 3.1 x10E3/uL   Monocytes Absolute 0.6 0.1 - 0.9 x10E3/uL   EOS (ABSOLUTE) 0.1 0.0 - 0.4 x10E3/uL   Basophils Absolute 0.0 0.0 - 0.2 x10E3/uL   Immature Granulocytes 1 Not Estab. %   Immature Grans (Abs) 0.1 0.0 - 0.1 x10E3/uL  CK     Status: Abnormal   Collection Time: 04/24/23  9:39 AM  Result Value Ref Range   Total CK 416 (H) 30 - 208 U/L  Lipid panel     Status: Abnormal   Collection Time: 04/24/23  9:39 AM  Result Value Ref Range   Cholesterol, Total 200 (H) 100 - 199 mg/dL   Triglycerides 52 0 - 149 mg/dL   HDL 89 >60 mg/dL   VLDL Cholesterol Cal 10 5 - 40 mg/dL   LDL Chol Calc (NIH) 898 (H) 0 - 99 mg/dL   Chol/HDL Ratio 2.2 0.0 - 5.0 ratio    Comment:  T. Chol/HDL Ratio                                             Men  Women                               1/2 Avg.Risk  3.4    3.3                                   Avg.Risk  5.0    4.4                                2X Avg.Risk  9.6    7.1                                3X Avg.Risk 23.4   11.0       Assessment & Plan:  As per problem list  Problem List Items Addressed This Visit        Nervous and Auditory   Sciatica of right side   Relevant Orders   Comprehensive metabolic panel     Other   Pure hypercholesterolemia - Primary   Relevant Orders   CBC With Diff/Platelet   Lipid panel   CK    Return in about 18 weeks (around 09/01/2023) for awv with labs prior.   Total time spent: 20 minutes  Sherrill Cinderella Perry, MD  04/28/2023   This document may have been prepared by [facility] Voice Recognition software and as such may include unintentional dictation errors.

## 2023-08-05 NOTE — Progress Notes (Signed)
 Pt does not smoke. Pt has insurance. Pt was given a lifestyle packet.

## 2023-08-31 ENCOUNTER — Other Ambulatory Visit

## 2023-08-31 DIAGNOSIS — E7800 Pure hypercholesterolemia, unspecified: Secondary | ICD-10-CM

## 2023-08-31 DIAGNOSIS — M5431 Sciatica, right side: Secondary | ICD-10-CM

## 2023-09-01 LAB — CBC WITH DIFF/PLATELET
Basophils Absolute: 0 10*3/uL (ref 0.0–0.2)
Basos: 1 %
EOS (ABSOLUTE): 0.2 10*3/uL (ref 0.0–0.4)
Eos: 3 %
Hematocrit: 39 % (ref 37.5–51.0)
Hemoglobin: 12.6 g/dL — ABNORMAL LOW (ref 13.0–17.7)
Immature Grans (Abs): 0.1 10*3/uL (ref 0.0–0.1)
Immature Granulocytes: 1 %
Lymphocytes Absolute: 1.7 10*3/uL (ref 0.7–3.1)
Lymphs: 27 %
MCH: 30.2 pg (ref 26.6–33.0)
MCHC: 32.3 g/dL (ref 31.5–35.7)
MCV: 94 fL (ref 79–97)
Monocytes Absolute: 0.8 10*3/uL (ref 0.1–0.9)
Monocytes: 12 %
Neutrophils Absolute: 3.8 10*3/uL (ref 1.4–7.0)
Neutrophils: 56 %
Platelets: 260 10*3/uL (ref 150–450)
RBC: 4.17 x10E6/uL (ref 4.14–5.80)
RDW: 13.1 % (ref 11.6–15.4)
WBC: 6.6 10*3/uL (ref 3.4–10.8)

## 2023-09-01 LAB — LIPID PANEL
Chol/HDL Ratio: 1.9 ratio (ref 0.0–5.0)
Cholesterol, Total: 182 mg/dL (ref 100–199)
HDL: 96 mg/dL (ref >39)
LDL Chol Calc (NIH): 75 mg/dL (ref 0–99)
Triglycerides: 56 mg/dL (ref 0–149)
VLDL Cholesterol Cal: 11 mg/dL (ref 5–40)

## 2023-09-01 LAB — COMPREHENSIVE METABOLIC PANEL WITH GFR
ALT: 17 IU/L (ref 0–44)
AST: 34 IU/L (ref 0–40)
Albumin: 4.3 g/dL (ref 3.7–4.7)
Alkaline Phosphatase: 110 IU/L (ref 44–121)
BUN/Creatinine Ratio: 22 (ref 10–24)
BUN: 21 mg/dL (ref 8–27)
Bilirubin Total: 0.5 mg/dL (ref 0.0–1.2)
CO2: 23 mmol/L (ref 20–29)
Calcium: 9.5 mg/dL (ref 8.6–10.2)
Chloride: 103 mmol/L (ref 96–106)
Creatinine, Ser: 0.97 mg/dL (ref 0.76–1.27)
Globulin, Total: 2.8 g/dL (ref 1.5–4.5)
Glucose: 93 mg/dL (ref 70–99)
Potassium: 4.9 mmol/L (ref 3.5–5.2)
Sodium: 141 mmol/L (ref 134–144)
Total Protein: 7.1 g/dL (ref 6.0–8.5)
eGFR: 75 mL/min/{1.73_m2} (ref >59)

## 2023-09-01 LAB — CK: Total CK: 633 U/L (ref 30–208)

## 2023-09-04 ENCOUNTER — Ambulatory Visit: Payer: Medicare HMO | Admitting: Internal Medicine

## 2023-09-06 ENCOUNTER — Ambulatory Visit (INDEPENDENT_AMBULATORY_CARE_PROVIDER_SITE_OTHER): Admitting: Internal Medicine

## 2023-09-06 ENCOUNTER — Encounter: Payer: Self-pay | Admitting: Internal Medicine

## 2023-09-06 ENCOUNTER — Ambulatory Visit: Payer: Self-pay | Admitting: Internal Medicine

## 2023-09-06 DIAGNOSIS — E7800 Pure hypercholesterolemia, unspecified: Secondary | ICD-10-CM | POA: Diagnosis not present

## 2023-09-06 DIAGNOSIS — R030 Elevated blood-pressure reading, without diagnosis of hypertension: Secondary | ICD-10-CM

## 2023-09-06 DIAGNOSIS — M5431 Sciatica, right side: Secondary | ICD-10-CM

## 2023-09-06 DIAGNOSIS — Z0001 Encounter for general adult medical examination with abnormal findings: Secondary | ICD-10-CM

## 2023-09-06 DIAGNOSIS — Z1331 Encounter for screening for depression: Secondary | ICD-10-CM | POA: Diagnosis not present

## 2023-09-06 MED ORDER — GABAPENTIN 100 MG PO CAPS: 100.0000 mg | ORAL_CAPSULE | Freq: Every day | ORAL | 1 refills | Status: DC

## 2023-09-06 MED ORDER — GABAPENTIN 100 MG PO CAPS: 100.0000 mg | ORAL_CAPSULE | Freq: Every day | ORAL | 0 refills | Status: DC

## 2023-09-06 MED ORDER — SIMVASTATIN 10 MG PO TABS: 10.0000 mg | ORAL_TABLET | Freq: Every day | ORAL | 1 refills | Status: DC

## 2023-09-06 NOTE — Progress Notes (Signed)
 Established Patient Office Visit  Subjective:  Patient ID: Troy Arroyo, male    DOB: 1932-05-04  Age: 89 y.o. MRN: [id number]  Chief Complaint  Patient presents with   Annual Exam    AWV with labs    No new complaints, here for AWV refer to quality metrics and scanned documents.  LDL and TC well controlled on lab review. Triglycerides also satisfactory with stable cbc and unremarkable cmp while CK elevated but denies any muscle aches.    No other concerns at this time.   Past Medical History:  Diagnosis Date   Cancer [facility])     Past Surgical History:  Procedure Laterality Date   HERNIA REPAIR      Social History   Socioeconomic History   Marital status: Married    Spouse name: Not on file   Number of children: Not on file   Years of education: Not on file   Highest education level: Not on file  Occupational History   Not on file  Tobacco Use   Smoking status: Never   Smokeless tobacco: Never  Substance and Sexual Activity   Alcohol use: Yes    Comment: occasionally   Drug use: Not on file   Sexual activity: Not on file  Other Topics Concern   Not on file  Social History Narrative   Not on file   Social Drivers of Health   Financial Resource Strain: Not on file  Food Insecurity: No Food Insecurity (08/05/2023)   Hunger Vital Sign    Worried About Running Out of Food in the Last Year: Never true    Ran Out of Food in the Last Year: Never true  Transportation Needs: No Transportation Needs (08/05/2023)   PRAPARE - Administrator, Civil Service (Medical): No    Lack of Transportation (Non-Medical): No  Physical Activity: Not on file  Stress: Not on file  Social Connections: Not on file  Intimate Partner Violence: Not At Risk (08/05/2023)   Humiliation, Afraid, Rape, and Kick questionnaire    Fear of Current or Ex-Partner: No    Emotionally Abused: No    Physically Abused: No    Sexually Abused: No    No family history on file.  No  Known Allergies  Outpatient Medications Prior to Visit  Medication Sig   [DISCONTINUED] Iron Polysacch [id number] (POLY-IRON 150 FORTE) 150-0.025-1 MG CAPS Take 1 capsule by mouth daily.   [DISCONTINUED] simvastatin  (ZOCOR ) 10 MG tablet Take 1 tablet (10 mg total) by mouth daily at 6 PM.   [DISCONTINUED] traMADol  (ULTRAM ) 50 MG tablet Take 1 tablet (50 mg total) by mouth every 12 (twelve) hours as needed.   No facility-administered medications prior to visit.    Review of Systems  Constitutional:  Positive for weight loss (lost 5 lbs).  Musculoskeletal:  Positive for joint pain.  All other systems reviewed and are negative.      Objective:   BP (!) 140/70   Pulse (!) 51   Temp (!) 95.6 F (35.3 C)   Ht 5\' 9"  (1.753 m)   Wt 157 lb 6.4 oz (71.4 kg)   SpO2 100%   BMI 23.24 kg/m   Vitals:   09/06/23 0818  BP: (!) 140/70  Pulse: (!) 51  Temp: (!) 95.6 F (35.3 C)  Height: 5\' 9"  (1.753 m)  Weight: 157 lb 6.4 oz (71.4 kg)  SpO2: 100%  BMI (Calculated): 23.23    Physical Exam Vitals reviewed.  Constitutional:  Appearance: Normal appearance.  HENT:     Head: Normocephalic.     Left Ear: There is no impacted cerumen.     Nose: Nose normal.     Mouth/Throat:     Mouth: Mucous membranes are moist.     Pharynx: No posterior oropharyngeal erythema.  Eyes:     Extraocular Movements: Extraocular movements intact.     Pupils: Pupils are equal, round, and reactive to light.  Cardiovascular:     Rate and Rhythm: Regular rhythm.     Chest Wall: PMI is not displaced.     Pulses: Normal pulses.     Heart sounds: Normal heart sounds. No murmur heard. Pulmonary:     Effort: Pulmonary effort is normal.     Breath sounds: Normal air entry. Wheezing present. No rhonchi or rales.  Abdominal:     General: Abdomen is flat. Bowel sounds are normal. There is no distension.     Palpations: Abdomen is soft. There is no hepatomegaly, splenomegaly or mass.     Tenderness: There  is no abdominal tenderness.  Musculoskeletal:        General: Normal range of motion.     Cervical back: Normal range of motion and neck supple.     Right lower leg: No edema.     Left lower leg: No edema.  Skin:    General: Skin is warm and dry.  Neurological:     General: No focal deficit present.     Mental Status: He is alert and oriented to person, place, and time.     Cranial Nerves: No cranial nerve deficit.     Motor: No weakness.  Psychiatric:        Mood and Affect: Mood normal.        Behavior: Behavior normal.      No results found for any visits on 09/06/23.  Recent Results (from the past 2160 hours)  CK     Status: Abnormal   Collection Time: 08/31/23  8:29 AM  Result Value Ref Range   Total CK 633 (HH) 30 - 208 U/L  Lipid panel     Status: None   Collection Time: 08/31/23  8:29 AM  Result Value Ref Range   Cholesterol, Total 182 100 - 199 mg/dL   Triglycerides 56 0 - 149 mg/dL   HDL 96 >16 mg/dL   VLDL Cholesterol Cal 11 5 - 40 mg/dL   LDL Chol Calc (NIH) 75 0 - 99 mg/dL   Chol/HDL Ratio 1.9 0.0 - 5.0 ratio    Comment:                                   T. Chol/HDL Ratio                                             Men  Women                               1/2 Avg.Risk  3.4    3.3                                   Avg.Risk  5.0  4.4                                2X Avg.Risk  9.6    7.1                                3X Avg.Risk 23.4   11.0   Comprehensive metabolic panel     Status: None   Collection Time: 08/31/23  8:29 AM  Result Value Ref Range   Glucose 93 70 - 99 mg/dL   BUN 21 8 - 27 mg/dL   Creatinine, Ser 1.61 0.76 - 1.27 mg/dL   eGFR 75 >09 UE/AVW/0.98   BUN/Creatinine Ratio 22 10 - 24   Sodium 141 134 - 144 mmol/L   Potassium 4.9 3.5 - 5.2 mmol/L   Chloride 103 96 - 106 mmol/L   CO2 23 20 - 29 mmol/L   Calcium 9.5 8.6 - 10.2 mg/dL   Total Protein 7.1 6.0 - 8.5 g/dL   Albumin 4.3 3.7 - 4.7 g/dL   Globulin, Total 2.8 1.5 - 4.5 g/dL    Bilirubin Total 0.5 0.0 - 1.2 mg/dL   Alkaline Phosphatase 110 44 - 121 IU/L   AST 34 0 - 40 IU/L   ALT 17 0 - 44 IU/L  CBC With Diff/Platelet     Status: Abnormal   Collection Time: 08/31/23  8:29 AM  Result Value Ref Range   WBC 6.6 3.4 - 10.8 x10E3/uL   RBC 4.17 4.14 - 5.80 x10E6/uL   Hemoglobin 12.6 (L) 13.0 - 17.7 g/dL   Hematocrit 11.9 14.7 - 51.0 %   MCV 94 79 - 97 fL   MCH 30.2 26.6 - 33.0 pg   MCHC 32.3 31.5 - 35.7 g/dL   RDW 82.9 56.2 - 13.0 %   Platelets 260 150 - 450 x10E3/uL   Neutrophils 56 Not Estab. %   Lymphs 27 Not Estab. %   Monocytes 12 Not Estab. %   Eos 3 Not Estab. %   Basos 1 Not Estab. %   Neutrophils Absolute 3.8 1.4 - 7.0 x10E3/uL   Lymphocytes Absolute 1.7 0.7 - 3.1 x10E3/uL   Monocytes Absolute 0.8 0.1 - 0.9 x10E3/uL   EOS (ABSOLUTE) 0.2 0.0 - 0.4 x10E3/uL   Basophils Absolute 0.0 0.0 - 0.2 x10E3/uL   Immature Granulocytes 1 Not Estab. %   Immature Grans (Abs) 0.1 0.0 - 0.1 x10E3/uL      09/06/2023    9:02 AM 05/30/2022   10:59 AM  6CIT Screen  What Year? 0 points 0 points  What month? 0 points 0 points  What time? 0 points 0 points  Count back from 20 2 points 0 points  Months in reverse 0 points 0 points  Repeat phrase 4 points 6 points  Total Score 6 points 6 points        Assessment & Plan:  As per problem list. Problem List Items Addressed This Visit       Nervous and Auditory   Sciatica of right side   Relevant Medications   gabapentin  (NEURONTIN ) 100 MG capsule     Other   Pure hypercholesterolemia   Relevant Medications   simvastatin  (ZOCOR ) 10 MG tablet   Other Relevant Orders   Lipid panel   CK    Return in 4 months (on 01/07/2024) for fu with labs prior.  Total time spent: 30 minutes  Arzella Bitters, MD  09/06/2023   This document may have been prepared by [facility] Voice Recognition software and as such may include unintentional dictation errors.

## 2023-10-11 NOTE — Progress Notes (Signed)
 The patient attended a screening event on 08/05/2023 where his BP screening results was 197/91. At the event the patient noted he has  [employer] for insurance and does not smoke. Patient did not have any SDOH insecurities. Pt did not list pcp. At the event pt was instructed to contact pcp about BP, pt was referred to the [facility], pt was also given lifestyle packet at the event by clinician. Per chart review pt has a pcp and the last office visit was on 09/06/2023 by pcp Dr. Shari Daughters, MD for hypercholesterolemia. The pt BP was 140/70 on 09/06/2023. According to chart review pt is currently on Simvastatin  to manage cholesterol. Chart review also indicates a future appt on 01/08/2024 with pcp. No additional Health equity team support indicated at this time.

## 2023-10-20 ENCOUNTER — Encounter: Payer: Self-pay | Admitting: Internal Medicine

## 2023-10-20 ENCOUNTER — Ambulatory Visit (INDEPENDENT_AMBULATORY_CARE_PROVIDER_SITE_OTHER): Admitting: Internal Medicine

## 2023-10-20 VITALS — BP 170/90 | HR 53 | Temp 96.4°F | Ht 69.0 in | Wt 156.8 lb

## 2023-10-20 DIAGNOSIS — R600 Localized edema: Secondary | ICD-10-CM

## 2023-10-20 DIAGNOSIS — Z0130 Encounter for examination of blood pressure without abnormal findings: Secondary | ICD-10-CM

## 2023-10-20 DIAGNOSIS — M660 Rupture of popliteal cyst: Secondary | ICD-10-CM | POA: Diagnosis not present

## 2023-10-20 MED ORDER — MELOXICAM 15 MG PO TABS: 15.0000 mg | ORAL_TABLET | Freq: Every day | ORAL | 0 refills | Status: AC

## 2023-10-20 NOTE — Progress Notes (Signed)
 Established Patient Office Visit  Subjective:  Patient ID: Troy Arroyo, male    DOB: 1933-04-10  Age: 89 y.o. MRN: [id number]  Chief Complaint  Patient presents with   Acute Visit    Right leg pain    C/o right leg pain x several weeks with associated swelling. Denies back pain or trauma. Pain is in the posterior thigh and calf 7/10 severity, relieved by Gabapentin .    No other concerns at this time.   Past Medical History:  Diagnosis Date   Cancer [facility])     Past Surgical History:  Procedure Laterality Date   HERNIA REPAIR      Social History   Socioeconomic History   Marital status: Married    Spouse name: Not on file   Number of children: Not on file   Years of education: Not on file   Highest education level: Not on file  Occupational History   Not on file  Tobacco Use   Smoking status: Never   Smokeless tobacco: Never  Substance and Sexual Activity   Alcohol use: Yes    Comment: occasionally   Drug use: Not on file   Sexual activity: Not on file  Other Topics Concern   Not on file  Social History Narrative   Not on file   Social Drivers of Health   Financial Resource Strain: Not on file  Food Insecurity: No Food Insecurity (08/05/2023)   Hunger Vital Sign    Worried About Running Out of Food in the Last Year: Never true    Ran Out of Food in the Last Year: Never true  Transportation Needs: No Transportation Needs (08/05/2023)   PRAPARE - Administrator, Civil Service (Medical): No    Lack of Transportation (Non-Medical): No  Physical Activity: Not on file  Stress: Not on file  Social Connections: Not on file  Intimate Partner Violence: Not At Risk (08/05/2023)   Humiliation, Afraid, Rape, and Kick questionnaire    Fear of Current or Ex-Partner: No    Emotionally Abused: No    Physically Abused: No    Sexually Abused: No    No family history on file.  No Known Allergies  Outpatient Medications Prior to Visit  Medication  Sig   gabapentin  (NEURONTIN ) 100 MG capsule Take 1 capsule (100 mg total) by mouth at bedtime.   simvastatin  (ZOCOR ) 10 MG tablet Take 1 tablet (10 mg total) by mouth daily at 6 PM.   No facility-administered medications prior to visit.    Review of Systems  All other systems reviewed and are negative.      Objective:   BP (!) 170/90   Pulse (!) 53   Temp (!) 96.4 F (35.8 C)   Ht 5' 9 (1.753 m)   Wt 156 lb 12.8 oz (71.1 kg)   SpO2 98%   BMI 23.16 kg/m   Vitals:   10/20/23 1141  BP: (!) 170/90  Pulse: (!) 53  Temp: (!) 96.4 F (35.8 C)  Height: 5' 9 (1.753 m)  Weight: 156 lb 12.8 oz (71.1 kg)  SpO2: 98%  BMI (Calculated): 23.14    Physical Exam Vitals reviewed.  Constitutional:      Appearance: Normal appearance.  HENT:     Head: Normocephalic.     Left Ear: There is no impacted cerumen.     Nose: Nose normal.     Mouth/Throat:     Mouth: Mucous membranes are moist.  Pharynx: No posterior oropharyngeal erythema.   Eyes:     Extraocular Movements: Extraocular movements intact.     Pupils: Pupils are equal, round, and reactive to light.    Cardiovascular:     Rate and Rhythm: Regular rhythm.     Chest Wall: PMI is not displaced.     Pulses: Normal pulses.     Heart sounds: Normal heart sounds. No murmur heard. Pulmonary:     Effort: Pulmonary effort is normal.     Breath sounds: Normal air entry. Wheezing present. No rhonchi or rales.  Abdominal:     General: Abdomen is flat. Bowel sounds are normal. There is no distension.     Palpations: Abdomen is soft. There is no hepatomegaly, splenomegaly or mass.     Tenderness: There is no abdominal tenderness.   Musculoskeletal:        General: Normal range of motion.     Cervical back: Normal range of motion and neck supple.     Right lower leg: Tenderness (calf) present. 3+ Pitting Edema present.     Left lower leg: No edema.   Skin:    General: Skin is warm and dry.   Neurological:      General: No focal deficit present.     Mental Status: He is alert and oriented to person, place, and time.     Cranial Nerves: No cranial nerve deficit.     Motor: No weakness.   Psychiatric:        Mood and Affect: Mood normal.        Behavior: Behavior normal.      No results found for any visits on 10/20/23.  Recent Results (from the past 2160 hours)  CK     Status: Abnormal   Collection Time: 08/31/23  8:29 AM  Result Value Ref Range   Total CK 633 (HH) 30 - 208 U/L  Lipid panel     Status: None   Collection Time: 08/31/23  8:29 AM  Result Value Ref Range   Cholesterol, Total 182 100 - 199 mg/dL   Triglycerides 56 0 - 149 mg/dL   HDL 96 >60 mg/dL   VLDL Cholesterol Cal 11 5 - 40 mg/dL   LDL Chol Calc (NIH) 75 0 - 99 mg/dL   Chol/HDL Ratio 1.9 0.0 - 5.0 ratio    Comment:                                   T. Chol/HDL Ratio                                             Men  Women                               1/2 Avg.Risk  3.4    3.3                                   Avg.Risk  5.0    4.4                                2X  Avg.Risk  9.6    7.1                                3X Avg.Risk 23.4   11.0   Comprehensive metabolic panel     Status: None   Collection Time: 08/31/23  8:29 AM  Result Value Ref Range   Glucose 93 70 - 99 mg/dL   BUN 21 8 - 27 mg/dL   Creatinine, Ser 9.02 0.76 - 1.27 mg/dL   eGFR 75 >40 fO/fpw/8.26   BUN/Creatinine Ratio 22 10 - 24   Sodium 141 134 - 144 mmol/L   Potassium 4.9 3.5 - 5.2 mmol/L   Chloride 103 96 - 106 mmol/L   CO2 23 20 - 29 mmol/L   Calcium 9.5 8.6 - 10.2 mg/dL   Total Protein 7.1 6.0 - 8.5 g/dL   Albumin 4.3 3.7 - 4.7 g/dL   Globulin, Total 2.8 1.5 - 4.5 g/dL   Bilirubin Total 0.5 0.0 - 1.2 mg/dL   Alkaline Phosphatase 110 44 - 121 IU/L   AST 34 0 - 40 IU/L   ALT 17 0 - 44 IU/L  CBC With Diff/Platelet     Status: Abnormal   Collection Time: 08/31/23  8:29 AM  Result Value Ref Range   WBC 6.6 3.4 - 10.8 x10E3/uL   RBC 4.17  4.14 - 5.80 x10E6/uL   Hemoglobin 12.6 (L) 13.0 - 17.7 g/dL   Hematocrit 60.9 62.4 - 51.0 %   MCV 94 79 - 97 fL   MCH 30.2 26.6 - 33.0 pg   MCHC 32.3 31.5 - 35.7 g/dL   RDW 86.8 88.3 - 84.5 %   Platelets 260 150 - 450 x10E3/uL   Neutrophils 56 Not Estab. %   Lymphs 27 Not Estab. %   Monocytes 12 Not Estab. %   Eos 3 Not Estab. %   Basos 1 Not Estab. %   Neutrophils Absolute 3.8 1.4 - 7.0 x10E3/uL   Lymphocytes Absolute 1.7 0.7 - 3.1 x10E3/uL   Monocytes Absolute 0.8 0.1 - 0.9 x10E3/uL   EOS (ABSOLUTE) 0.2 0.0 - 0.4 x10E3/uL   Basophils Absolute 0.0 0.0 - 0.2 x10E3/uL   Immature Granulocytes 1 Not Estab. %   Immature Grans (Abs) 0.1 0.0 - 0.1 x10E3/uL      Assessment & Plan:  As per problem list. Problem List Items Addressed This Visit   None Visit Diagnoses       Leg edema, right    -  Primary   Relevant Orders   US  Venous Img Lower Unilateral Right     Baker'Rajveer Handler cyst, ruptured       Relevant Medications   meloxicam (MOBIC) 15 MG tablet       Return in about 2 weeks (around 11/03/2023) for leg pain f/u.   Total time spent: 30 minutes  Sherrill Cinderella Perry, MD  10/20/2023   This document may have been prepared by [facility] Voice Recognition software and as such may include unintentional dictation errors.

## 2023-10-25 ENCOUNTER — Ambulatory Visit (INDEPENDENT_AMBULATORY_CARE_PROVIDER_SITE_OTHER)

## 2023-10-25 DIAGNOSIS — R600 Localized edema: Secondary | ICD-10-CM | POA: Diagnosis not present

## 2023-10-30 ENCOUNTER — Ambulatory Visit: Payer: Self-pay | Admitting: Internal Medicine

## 2023-10-31 NOTE — Progress Notes (Signed)
Pt called and left vm returning our call, requesting a call back 

## 2023-12-07 ENCOUNTER — Other Ambulatory Visit: Payer: Self-pay | Admitting: Internal Medicine

## 2023-12-07 DIAGNOSIS — M5431 Sciatica, right side: Secondary | ICD-10-CM

## 2024-01-08 ENCOUNTER — Other Ambulatory Visit

## 2024-01-08 ENCOUNTER — Ambulatory Visit: Admitting: Internal Medicine

## 2024-01-08 DIAGNOSIS — E7800 Pure hypercholesterolemia, unspecified: Secondary | ICD-10-CM

## 2024-01-08 DIAGNOSIS — M5431 Sciatica, right side: Secondary | ICD-10-CM

## 2024-01-09 LAB — LIPID PANEL
Chol/HDL Ratio: 2.1 ratio (ref 0.0–5.0)
Cholesterol, Total: 172 mg/dL (ref 100–199)
HDL: 83 mg/dL (ref >39)
LDL Chol Calc (NIH): 74 mg/dL (ref 0–99)
Triglycerides: 81 mg/dL (ref 0–149)
VLDL Cholesterol Cal: 15 mg/dL (ref 5–40)

## 2024-01-09 LAB — CK: Total CK: 334 U/L — ABNORMAL HIGH (ref 30–208)

## 2024-01-15 ENCOUNTER — Ambulatory Visit (INDEPENDENT_AMBULATORY_CARE_PROVIDER_SITE_OTHER): Admitting: Internal Medicine

## 2024-01-15 ENCOUNTER — Ambulatory Visit: Payer: Self-pay | Admitting: Internal Medicine

## 2024-01-15 VITALS — BP 160/68 | HR 67 | Temp 98.4°F | Ht 69.0 in | Wt 154.4 lb

## 2024-01-15 DIAGNOSIS — E7800 Pure hypercholesterolemia, unspecified: Secondary | ICD-10-CM

## 2024-01-15 DIAGNOSIS — M5431 Sciatica, right side: Secondary | ICD-10-CM | POA: Diagnosis not present

## 2024-01-15 NOTE — Progress Notes (Signed)
 Established Patient Office Visit  Subjective:  Patient ID: Troy Arroyo, male    DOB: 15-Mar-1933  Age: 89 y.o. MRN: [id number]  Chief Complaint  Patient presents with   Follow-up    4 month lab results    Leg pain has improved on gabapentin . LDL and TC well controlled on lab review. Triglycerides also satisfactory. BP elevated today but admits to dietary salt indiscretion.    No other concerns at this time.   Past Medical History:  Diagnosis Date   Cancer [facility])     Past Surgical History:  Procedure Laterality Date   HERNIA REPAIR      Social History   Socioeconomic History   Marital status: Married    Spouse name: Not on file   Number of children: Not on file   Years of education: Not on file   Highest education level: Not on file  Occupational History   Not on file  Tobacco Use   Smoking status: Never   Smokeless tobacco: Never  Substance and Sexual Activity   Alcohol use: Yes    Comment: occasionally   Drug use: Not on file   Sexual activity: Not on file  Other Topics Concern   Not on file  Social History Narrative   Not on file   Social Drivers of Health   Financial Resource Strain: Not on file  Food Insecurity: No Food Insecurity (08/05/2023)   Hunger Vital Sign    Worried About Running Out of Food in the Last Year: Never true    Ran Out of Food in the Last Year: Never true  Transportation Needs: No Transportation Needs (08/05/2023)   PRAPARE - Administrator, Civil Service (Medical): No    Lack of Transportation (Non-Medical): No  Physical Activity: Not on file  Stress: Not on file  Social Connections: Not on file  Intimate Partner Violence: Not At Risk (08/05/2023)   Humiliation, Afraid, Rape, and Kick questionnaire    Fear of Current or Ex-Partner: No    Emotionally Abused: No    Physically Abused: No    Sexually Abused: No    No family history on file.  No Known Allergies  Outpatient Medications Prior to Visit   Medication Sig   gabapentin  (NEURONTIN ) 100 MG capsule TAKE 1 CAPSULE(100 MG) BY MOUTH AT BEDTIME   simvastatin  (ZOCOR ) 10 MG tablet Take 1 tablet (10 mg total) by mouth daily at 6 PM.   No facility-administered medications prior to visit.    Review of Systems  All other systems reviewed and are negative.      Objective:   BP (!) 160/68 (Cuff Size: Normal)   Pulse 67   Temp 98.4 F (36.9 C)   Ht 5' 9 (1.753 m)   Wt 154 lb 6.4 oz (70 kg)   SpO2 98%   BMI 22.80 kg/m   Vitals:   01/15/24 1457  BP: (!) 160/68  Pulse: 67  Temp: 98.4 F (36.9 C)  Height: 5' 9 (1.753 m)  Weight: 154 lb 6.4 oz (70 kg)  SpO2: 98%  BMI (Calculated): 22.79    Physical Exam Vitals reviewed.  Constitutional:      Appearance: Normal appearance.  HENT:     Head: Normocephalic.     Left Ear: There is no impacted cerumen.     Nose: Nose normal.     Mouth/Throat:     Mouth: Mucous membranes are moist.     Pharynx: No posterior oropharyngeal erythema.  Eyes:     Extraocular Movements: Extraocular movements intact.     Pupils: Pupils are equal, round, and reactive to light.  Cardiovascular:     Rate and Rhythm: Regular rhythm.     Chest Wall: PMI is not displaced.     Pulses: Normal pulses.     Heart sounds: Normal heart sounds. No murmur heard. Pulmonary:     Effort: Pulmonary effort is normal.     Breath sounds: Normal air entry. Wheezing present. No rhonchi or rales.  Abdominal:     General: Abdomen is flat. Bowel sounds are normal. There is no distension.     Palpations: Abdomen is soft. There is no hepatomegaly, splenomegaly or mass.     Tenderness: There is no abdominal tenderness.  Musculoskeletal:        General: Normal range of motion.     Cervical back: Normal range of motion and neck supple.     Right lower leg: Tenderness (calf) present. 3+ Pitting Edema present.     Left lower leg: No edema.  Skin:    General: Skin is warm and dry.  Neurological:     General: No  focal deficit present.     Mental Status: He is alert and oriented to person, place, and time.     Cranial Nerves: No cranial nerve deficit.     Motor: No weakness.  Psychiatric:        Mood and Affect: Mood normal.        Behavior: Behavior normal.      No results found for any visits on 01/15/24.  Recent Results (from the past 2160 hours)  CK     Status: Abnormal   Collection Time: 01/08/24  8:21 AM  Result Value Ref Range   Total CK 334 (H) 30 - 208 U/L  Lipid panel     Status: None   Collection Time: 01/08/24  8:21 AM  Result Value Ref Range   Cholesterol, Total 172 100 - 199 mg/dL   Triglycerides 81 0 - 149 mg/dL   HDL 83 >60 mg/dL   VLDL Cholesterol Cal 15 5 - 40 mg/dL   LDL Chol Calc (NIH) 74 0 - 99 mg/dL   Chol/HDL Ratio 2.1 0.0 - 5.0 ratio    Comment:                                   T. Chol/HDL Ratio                                             Men  Women                               1/2 Avg.Risk  3.4    3.3                                   Avg.Risk  5.0    4.4                                2X Avg.Risk  9.6    7.1  3X Avg.Risk 23.4   11.0       Assessment & Plan:  As per problem list. Recheck bp in 2 wks. Low salt diet encouraged. Problem List Items Addressed This Visit       Nervous and Auditory   Sciatica of right side - Primary     Other   Pure hypercholesterolemia    Return in about 2 weeks (around 01/29/2024) for BP followup.   Total time spent: 20 minutes  Sherrill Cinderella Perry, MD  01/15/2024   This document may have been prepared by [facility] Voice Recognition software and as such may include unintentional dictation errors.

## 2024-01-29 ENCOUNTER — Encounter: Payer: Self-pay | Admitting: Internal Medicine

## 2024-01-29 ENCOUNTER — Ambulatory Visit (INDEPENDENT_AMBULATORY_CARE_PROVIDER_SITE_OTHER): Admitting: Internal Medicine

## 2024-01-29 VITALS — BP 130/68 | HR 79 | Temp 96.6°F | Ht 69.0 in | Wt 156.8 lb

## 2024-01-29 DIAGNOSIS — M5431 Sciatica, right side: Secondary | ICD-10-CM

## 2024-01-29 DIAGNOSIS — I158 Other secondary hypertension: Secondary | ICD-10-CM | POA: Diagnosis not present

## 2024-01-29 DIAGNOSIS — E7800 Pure hypercholesterolemia, unspecified: Secondary | ICD-10-CM | POA: Diagnosis not present

## 2024-01-29 MED ORDER — SIMVASTATIN 10 MG PO TABS: 10.0000 mg | ORAL_TABLET | Freq: Every day | ORAL | 1 refills | Status: AC

## 2024-01-29 NOTE — Progress Notes (Signed)
 Established Patient Office Visit  Subjective:  Patient ID: Troy Arroyo, male    DOB: September 28, 1932  Age: 89 y.o. MRN: [id number]  Chief Complaint  Patient presents with   Follow-up    2 week follow up    BP has improved and admits to better dietary salt discretion. 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     No other concerns at this time.   Past Medical History:  Diagnosis Date   Cancer [facility])     Past Surgical History:  Procedure Laterality Date   HERNIA REPAIR      Social History   Socioeconomic History   Marital status: Married    Spouse name: Not on file   Number of children: Not on file   Years of education: Not on file   Highest education level: Not on file  Occupational History   Not on file  Tobacco Use   Smoking status: Never   Smokeless tobacco: Never  Substance and Sexual Activity   Alcohol use: Yes    Comment: occasionally   Drug use: Not on file   Sexual activity: Not on file  Other Topics Concern   Not on file  Social History Narrative   Not on file   Social Drivers of Health   Financial Resource Strain: Not on file  Food Insecurity: No Food Insecurity (08/05/2023)   Hunger Vital Sign    Worried About Running Out of Food in the Last Year: Never true    Ran Out of Food in the Last Year: Never true  Transportation Needs: No Transportation Needs (08/05/2023)   PRAPARE - Administrator, Civil Service (Medical): No    Lack of Transportation (Non-Medical): No  Physical Activity: Not on file  Stress: Not on file  Social Connections: Not on file  Intimate Partner Violence: Not At Risk (08/05/2023)   Humiliation, Afraid, Rape, and Kick questionnaire    Fear of Current or Ex-Partner: No    Emotionally Abused: No    Physically Abused: No    Sexually Abused: No    No family history on file.  No Known Allergies  Outpatient Medications Prior to Visit  Medication Sig   gabapentin  (NEURONTIN ) 100 MG capsule  TAKE 1 CAPSULE(100 MG) BY MOUTH AT BEDTIME   [DISCONTINUED] simvastatin  (ZOCOR ) 10 MG tablet Take 1 tablet (10 mg total) by mouth daily at 6 PM.   No facility-administered medications prior to visit.    Review of Systems  All other systems reviewed and are negative.      Objective:   BP 130/68   Pulse 79   Temp (!) 96.6 F (35.9 C)   Ht 5' 9 (1.753 m)   Wt 156 lb 12.8 oz (71.1 kg)   SpO2 98%   BMI 23.16 kg/m   Vitals:   01/29/24 1029  BP: 130/68  Pulse: 79  Temp: (!) 96.6 F (35.9 C)  Height: 5' 9 (1.753 m)  Weight: 156 lb 12.8 oz (71.1 kg)  SpO2: 98%  BMI (Calculated): 23.14    Physical Exam   No results found for any visits on 01/29/24.  Recent Results (from the past 2160 hours)  CK     Status: Abnormal   Collection Time: 01/08/24  8:21 AM  Result Value Ref Range   Total CK 334 (H) 30 - 208 U/L  Lipid panel     Status: None   Collection Time: 01/08/24  8:21 AM  Result  Value Ref Range   Cholesterol, Total 172 100 - 199 mg/dL   Triglycerides 81 0 - 149 mg/dL   HDL 83 >60 mg/dL   VLDL Cholesterol Cal 15 5 - 40 mg/dL   LDL Chol Calc (NIH) 74 0 - 99 mg/dL   Chol/HDL Ratio 2.1 0.0 - 5.0 ratio    Comment:                                   T. Chol/HDL Ratio                                             Men  Women                               1/2 Avg.Risk  3.4    3.3                                   Avg.Risk  5.0    4.4                                2X Avg.Risk  9.6    7.1                                3X Avg.Risk 23.4   11.0       Assessment & Plan:  As per problem list.  Problem List Items Addressed This Visit       Cardiovascular and Mediastinum   Other secondary hypertension   Relevant Medications   simvastatin  (ZOCOR ) 10 MG tablet     Nervous and Auditory   Sciatica of right side     Other   Pure hypercholesterolemia - Primary   Relevant Medications   simvastatin  (ZOCOR ) 10 MG tablet    Return in about 14 weeks (around 05/06/2024).    Total time spent: 20 minutes  Sherrill Cinderella Perry, MD  01/29/2024   This document may have been prepared by [facility] Voice Recognition software and as such may include unintentional dictation errors.

## 2024-05-01 ENCOUNTER — Other Ambulatory Visit

## 2024-05-02 LAB — COMPREHENSIVE METABOLIC PANEL WITH GFR
ALT: 13 IU/L (ref 0–44)
AST: 22 IU/L (ref 0–40)
Albumin: 4 g/dL (ref 3.7–4.7)
Alkaline Phosphatase: 105 IU/L (ref 48–129)
BUN/Creatinine Ratio: 16 (ref 10–24)
BUN: 15 mg/dL (ref 8–27)
Bilirubin Total: 0.6 mg/dL (ref 0.0–1.2)
CO2: 24 mmol/L (ref 20–29)
Calcium: 9.4 mg/dL (ref 8.6–10.2)
Chloride: 104 mmol/L (ref 96–106)
Creatinine, Ser: 0.93 mg/dL (ref 0.76–1.27)
Globulin, Total: 2.4 g/dL (ref 1.5–4.5)
Glucose: 94 mg/dL (ref 70–99)
Potassium: 4.7 mmol/L (ref 3.5–5.2)
Sodium: 140 mmol/L (ref 134–144)
Total Protein: 6.4 g/dL (ref 6.0–8.5)
eGFR: 78 mL/min/1.73

## 2024-05-02 LAB — CBC WITH DIFF/PLATELET
Basophils Absolute: 0 x10E3/uL (ref 0.0–0.2)
Basos: 1 %
EOS (ABSOLUTE): 0.2 x10E3/uL (ref 0.0–0.4)
Eos: 2 %
Hematocrit: 39.7 % (ref 37.5–51.0)
Hemoglobin: 12.7 g/dL — ABNORMAL LOW (ref 13.0–17.7)
Immature Grans (Abs): 0 x10E3/uL (ref 0.0–0.1)
Immature Granulocytes: 0 %
Lymphocytes Absolute: 1.9 x10E3/uL (ref 0.7–3.1)
Lymphs: 29 %
MCH: 30.8 pg (ref 26.6–33.0)
MCHC: 32 g/dL (ref 31.5–35.7)
MCV: 96 fL (ref 79–97)
Monocytes Absolute: 0.7 x10E3/uL (ref 0.1–0.9)
Monocytes: 11 %
Neutrophils Absolute: 3.8 x10E3/uL (ref 1.4–7.0)
Neutrophils: 56 %
Platelets: 271 x10E3/uL (ref 150–450)
RBC: 4.13 x10E6/uL — ABNORMAL LOW (ref 4.14–5.80)
RDW: 12.7 % (ref 11.6–15.4)
WBC: 6.7 x10E3/uL (ref 3.4–10.8)

## 2024-05-02 LAB — LIPID PANEL
Chol/HDL Ratio: 2.3 ratio (ref 0.0–5.0)
Cholesterol, Total: 154 mg/dL (ref 100–199)
HDL: 68 mg/dL
LDL Chol Calc (NIH): 72 mg/dL (ref 0–99)
Triglycerides: 73 mg/dL (ref 0–149)
VLDL Cholesterol Cal: 14 mg/dL (ref 5–40)

## 2024-05-06 ENCOUNTER — Ambulatory Visit: Payer: Self-pay | Admitting: Internal Medicine

## 2024-05-06 ENCOUNTER — Ambulatory Visit: Admitting: Internal Medicine

## 2024-05-06 VITALS — BP 128/62 | HR 58 | Temp 98.0°F | Ht 69.0 in | Wt 157.8 lb

## 2024-05-06 DIAGNOSIS — D538 Other specified nutritional anemias: Secondary | ICD-10-CM

## 2024-05-06 DIAGNOSIS — M5431 Sciatica, right side: Secondary | ICD-10-CM | POA: Diagnosis not present

## 2024-05-06 DIAGNOSIS — J069 Acute upper respiratory infection, unspecified: Secondary | ICD-10-CM

## 2024-05-06 DIAGNOSIS — Z0130 Encounter for examination of blood pressure without abnormal findings: Secondary | ICD-10-CM

## 2024-05-06 DIAGNOSIS — E7800 Pure hypercholesterolemia, unspecified: Secondary | ICD-10-CM

## 2024-05-06 MED ORDER — BENZONATATE 100 MG PO CAPS: 100.0000 mg | ORAL_CAPSULE | Freq: Three times a day (TID) | ORAL | 1 refills | Status: AC | PRN

## 2024-05-06 MED ORDER — GABAPENTIN 100 MG PO CAPS: 100.0000 mg | ORAL_CAPSULE | Freq: Three times a day (TID) | ORAL | 2 refills | Status: AC

## 2024-05-06 NOTE — Progress Notes (Unsigned)
 "  Established Patient Office Visit  Subjective:  Patient ID: Troy Arroyo, male    DOB: 1932-09-28  Age: 89 y.o. MRN: [id number]  Chief Complaint  Patient presents with   Follow-up    14 week follow up    C/o URI with cough productive of white sputum x 2 wks. Also here for lab review and medication refills. LDL and TC well controlled on lab review. Triglycerides also satisfactory with stable cbc and unremarkable cmp.     No other concerns at this time.   Past Medical History:  Diagnosis Date   Cancer [facility])     Past Surgical History:  Procedure Laterality Date   HERNIA REPAIR      Social History   Socioeconomic History   Marital status: Married    Spouse name: Not on file   Number of children: Not on file   Years of education: Not on file   Highest education level: Not on file  Occupational History   Not on file  Tobacco Use   Smoking status: Never   Smokeless tobacco: Never  Substance and Sexual Activity   Alcohol use: Yes    Comment: occasionally   Drug use: Not on file   Sexual activity: Not on file  Other Topics Concern   Not on file  Social History Narrative   Not on file   Social Drivers of Health   Tobacco Use: Low Risk (01/29/2024)   Patient History    Smoking Tobacco Use: Never    Smokeless Tobacco Use: Never    Passive Exposure: Not on file  Recent Concern: Tobacco Use - Medium Risk (12/21/2023)   Received [facility]   Patient History    Smoking Tobacco Use: Former    Smokeless Tobacco Use: Never    Passive Exposure: Not on file  Financial Resource Strain: Not on file  Food Insecurity: No Food Insecurity (08/05/2023)   Hunger Vital Sign    Worried About Running Out of Food in the Last Year: Never true    Ran Out of Food in the Last Year: Never true  Transportation Needs: No Transportation Needs (08/05/2023)   PRAPARE - Administrator, Civil Service (Medical): No    Lack of Transportation (Non-Medical): No   Physical Activity: Not on file  Stress: Not on file  Social Connections: Not on file  Intimate Partner Violence: Not At Risk (08/05/2023)   Humiliation, Afraid, Rape, and Kick questionnaire    Fear of Current or Ex-Partner: No    Emotionally Abused: No    Physically Abused: No    Sexually Abused: No  Depression (PHQ2-9): Low Risk (09/06/2023)   Depression (PHQ2-9)    PHQ-2 Score: 0  Alcohol Screen: Not on file  Housing: Unknown (08/05/2023)   Housing Stability Vital Sign    Unable to Pay for Housing in the Last Year: No    Number of Times Moved in the Last Year: Not on file    Homeless in the Last Year: No  Utilities: Not At Risk (08/05/2023)   [employer] Utilities    Threatened with loss of utilities: No  Health Literacy: Not on file    No family history on file.  Allergies[1]  Show/hide medication list[2]  Review of Systems  Constitutional:  Positive for weight loss (lost 1 lbs).  Respiratory:  Positive for cough and sputum production. Negative for shortness of breath and wheezing.   Musculoskeletal:  Positive for joint pain.  All other  systems reviewed and are negative.      Objective:   BP 128/62   Pulse (!) 58   Temp 98 F (36.7 C)   Ht 5' 9 (1.753 m)   Wt 157 lb 12.8 oz (71.6 kg)   SpO2 100%   BMI 23.30 kg/m   Vitals:   05/06/24 0954  BP: 128/62  Pulse: (!) 58  Temp: 98 F (36.7 C)  Height: 5' 9 (1.753 m)  Weight: 157 lb 12.8 oz (71.6 kg)  SpO2: 100%  BMI (Calculated): 23.29    Physical Exam Vitals reviewed.  Constitutional:      Appearance: Normal appearance.  HENT:     Head: Normocephalic.     Left Ear: There is no impacted cerumen.     Nose: Nose normal.     Mouth/Throat:     Mouth: Mucous membranes are moist.     Pharynx: No posterior oropharyngeal erythema.  Eyes:     Extraocular Movements: Extraocular movements intact.     Pupils: Pupils are equal, round, and reactive to light.  Cardiovascular:     Rate and Rhythm: Regular rhythm.      Chest Wall: PMI is not displaced.     Pulses: Normal pulses.     Heart sounds: Normal heart sounds. No murmur heard. Pulmonary:     Effort: Pulmonary effort is normal.     Breath sounds: Normal air entry. Wheezing present. No rhonchi or rales.  Abdominal:     General: Abdomen is flat. Bowel sounds are normal. There is no distension.     Palpations: Abdomen is soft. There is no hepatomegaly, splenomegaly or mass.     Tenderness: There is no abdominal tenderness.  Musculoskeletal:        General: Normal range of motion.     Cervical back: Normal range of motion and neck supple.     Right lower leg: Tenderness (calf) present. 3+ Pitting Edema present.     Left lower leg: No edema.  Skin:    General: Skin is warm and dry.  Neurological:     General: No focal deficit present.     Mental Status: He is alert and oriented to person, place, and time.     Cranial Nerves: No cranial nerve deficit.     Motor: No weakness.  Psychiatric:        Mood and Affect: Mood normal.        Behavior: Behavior normal.      No results found for any visits on 05/06/24.  Recent Results (from the past 2160 hours)  Comprehensive metabolic panel     Status: None   Collection Time: 05/01/24  9:30 AM  Result Value Ref Range   Glucose 94 70 - 99 mg/dL   BUN 15 8 - 27 mg/dL   Creatinine, Ser 9.06 0.76 - 1.27 mg/dL   eGFR 78 >40 fO/fpw/8.26   BUN/Creatinine Ratio 16 10 - 24   Sodium 140 134 - 144 mmol/L   Potassium 4.7 3.5 - 5.2 mmol/L   Chloride 104 96 - 106 mmol/L   CO2 24 20 - 29 mmol/L   Calcium 9.4 8.6 - 10.2 mg/dL   Total Protein 6.4 6.0 - 8.5 g/dL   Albumin 4.0 3.7 - 4.7 g/dL   Globulin, Total 2.4 1.5 - 4.5 g/dL   Bilirubin Total 0.6 0.0 - 1.2 mg/dL   Alkaline Phosphatase 105 48 - 129 IU/L   AST 22 0 - 40 IU/L   ALT 13  0 - 44 IU/L  CBC With Diff/Platelet     Status: Abnormal   Collection Time: 05/01/24  9:30 AM  Result Value Ref Range   WBC 6.7 3.4 - 10.8 x10E3/uL   RBC 4.13 (L) 4.14 -  5.80 x10E6/uL   Hemoglobin 12.7 (L) 13.0 - 17.7 g/dL   Hematocrit 60.2 62.4 - 51.0 %   MCV 96 79 - 97 fL   MCH 30.8 26.6 - 33.0 pg   MCHC 32.0 31.5 - 35.7 g/dL   RDW 87.2 88.3 - 84.5 %   Platelets 271 150 - 450 x10E3/uL   Neutrophils 56 Not Estab. %   Lymphs 29 Not Estab. %   Monocytes 11 Not Estab. %   Eos 2 Not Estab. %   Basos 1 Not Estab. %   Neutrophils Absolute 3.8 1.4 - 7.0 x10E3/uL   Lymphocytes Absolute 1.9 0.7 - 3.1 x10E3/uL   Monocytes Absolute 0.7 0.1 - 0.9 x10E3/uL   EOS (ABSOLUTE) 0.2 0.0 - 0.4 x10E3/uL   Basophils Absolute 0.0 0.0 - 0.2 x10E3/uL   Immature Granulocytes 0 Not Estab. %   Immature Grans (Abs) 0.0 0.0 - 0.1 x10E3/uL  Lipid panel     Status: None   Collection Time: 05/01/24  9:30 AM  Result Value Ref Range   Cholesterol, Total 154 100 - 199 mg/dL   Triglycerides 73 0 - 149 mg/dL   HDL 68 >60 mg/dL   VLDL Cholesterol Cal 14 5 - 40 mg/dL   LDL Chol Calc (NIH) 72 0 - 99 mg/dL   Chol/HDL Ratio 2.3 0.0 - 5.0 ratio    Comment:                                   T. Chol/HDL Ratio                                             Men  Women                               1/2 Avg.Risk  3.4    3.3                                   Avg.Risk  5.0    4.4                                2X Avg.Risk  9.6    7.1                                3X Avg.Risk 23.4   11.0       Assessment & Plan:  Carlyn was seen today for follow-up.  Viral upper respiratory tract infection -     Benzonatate ; Take 1 capsule (100 mg total) by mouth 3 (three) times daily as needed for up to 20 days for cough.  Dispense: 30 capsule; Refill: 1  Pure hypercholesterolemia -     Lipid panel  Sciatica of right side -     Gabapentin ; Take 1 capsule (100 mg total)  by mouth 3 (three) times daily.  Dispense: 90 capsule; Refill: 2  Other specified nutritional anemias -     CBC With Diff/Platelet    Problem List Items Addressed This Visit       Nervous and Auditory   Sciatica of right side    Relevant Medications   gabapentin  (NEURONTIN ) 100 MG capsule     Other   Pure hypercholesterolemia   Other Visit Diagnoses       Viral upper respiratory tract infection    -  Primary   Relevant Medications   benzonatate  (TESSALON ) 100 MG capsule     Other specified nutritional anemias       Relevant Orders   CBC With Diff/Platelet       Return in about 18 weeks (around 09/09/2024) for awv with labs prior.   Total time spent: 20 minutes. This time includes review of previous notes and results and patient face to face interaction during today'Kyndal Gloster visit.    Sherrill Cinderella Perry, MD  05/06/2024   This document may have been prepared by [facility] Voice Recognition software and as such may include unintentional dictation errors.      [1] No Known Allergies [2]  Outpatient Medications Prior to Visit  Medication Sig   simvastatin  (ZOCOR ) 10 MG tablet Take 1 tablet (10 mg total) by mouth daily at 6 PM.   [DISCONTINUED] gabapentin  (NEURONTIN ) 100 MG capsule TAKE 1 CAPSULE(100 MG) BY MOUTH AT BEDTIME   No facility-administered medications prior to visit.   "

## 2024-09-03 ENCOUNTER — Ambulatory Visit: Admitting: Internal Medicine
